# Patient Record
Sex: Female | Born: 1935 | Race: White | Hispanic: No | Smoking: Former smoker
Health system: Southern US, Community
[De-identification: ages and names within clinical notes are randomized; demographics above are authoritative.]

## PROBLEM LIST (undated history)

## (undated) DIAGNOSIS — R634 Abnormal weight loss: Secondary | ICD-10-CM

## (undated) DIAGNOSIS — F0280 Dementia in other diseases classified elsewhere without behavioral disturbance: Secondary | ICD-10-CM

## (undated) DIAGNOSIS — I10 Essential (primary) hypertension: Secondary | ICD-10-CM

## (undated) DIAGNOSIS — G4700 Insomnia, unspecified: Secondary | ICD-10-CM

## (undated) DIAGNOSIS — R1311 Dysphagia, oral phase: Secondary | ICD-10-CM

## (undated) DIAGNOSIS — M179 Osteoarthritis of knee, unspecified: Secondary | ICD-10-CM

## (undated) DIAGNOSIS — R569 Unspecified convulsions: Secondary | ICD-10-CM

## (undated) DIAGNOSIS — E063 Autoimmune thyroiditis: Secondary | ICD-10-CM

## (undated) DIAGNOSIS — E785 Hyperlipidemia, unspecified: Secondary | ICD-10-CM

## (undated) DIAGNOSIS — E559 Vitamin D deficiency, unspecified: Secondary | ICD-10-CM

## (undated) DIAGNOSIS — R112 Nausea with vomiting, unspecified: Secondary | ICD-10-CM

## (undated) DIAGNOSIS — F329 Major depressive disorder, single episode, unspecified: Secondary | ICD-10-CM

## (undated) DIAGNOSIS — C50919 Malignant neoplasm of unspecified site of unspecified female breast: Secondary | ICD-10-CM

## (undated) DIAGNOSIS — R42 Dizziness and giddiness: Secondary | ICD-10-CM

## (undated) DIAGNOSIS — M1710 Unilateral primary osteoarthritis, unspecified knee: Secondary | ICD-10-CM

## (undated) DIAGNOSIS — E46 Unspecified protein-calorie malnutrition: Secondary | ICD-10-CM

## (undated) DIAGNOSIS — F419 Anxiety disorder, unspecified: Secondary | ICD-10-CM

## (undated) DIAGNOSIS — I498 Other specified cardiac arrhythmias: Secondary | ICD-10-CM

## (undated) DIAGNOSIS — G309 Alzheimer's disease, unspecified: Secondary | ICD-10-CM

## (undated) DIAGNOSIS — E039 Hypothyroidism, unspecified: Secondary | ICD-10-CM

## (undated) DIAGNOSIS — E875 Hyperkalemia: Secondary | ICD-10-CM

## (undated) DIAGNOSIS — F32A Depression, unspecified: Secondary | ICD-10-CM

## (undated) DIAGNOSIS — M810 Age-related osteoporosis without current pathological fracture: Secondary | ICD-10-CM

## (undated) HISTORY — DX: Age-related osteoporosis without current pathological fracture: M81.0

## (undated) HISTORY — DX: Abnormal weight loss: R63.4

## (undated) HISTORY — PX: EYE SURGERY: SHX253

## (undated) HISTORY — DX: Dizziness and giddiness: R42

## (undated) HISTORY — DX: Insomnia, unspecified: G47.00

## (undated) HISTORY — DX: Hyperkalemia: E87.5

## (undated) HISTORY — DX: Other specified cardiac arrhythmias: I49.8

## (undated) HISTORY — DX: Autoimmune thyroiditis: E06.3

## (undated) HISTORY — DX: Hypothyroidism, unspecified: E03.9

## (undated) HISTORY — DX: Unilateral primary osteoarthritis, unspecified knee: M17.10

## (undated) HISTORY — DX: Nausea with vomiting, unspecified: R11.2

## (undated) HISTORY — DX: Alzheimer's disease, unspecified: G30.9

## (undated) HISTORY — DX: Depression, unspecified: F32.A

## (undated) HISTORY — DX: Dementia in other diseases classified elsewhere, unspecified severity, without behavioral disturbance, psychotic disturbance, mood disturbance, and anxiety: F02.80

## (undated) HISTORY — DX: Dysphagia, oral phase: R13.11

## (undated) HISTORY — DX: Vitamin D deficiency, unspecified: E55.9

## (undated) HISTORY — DX: Osteoarthritis of knee, unspecified: M17.9

## (undated) HISTORY — DX: Anxiety disorder, unspecified: F41.9

## (undated) HISTORY — DX: Essential (primary) hypertension: I10

## (undated) HISTORY — DX: Hyperlipidemia, unspecified: E78.5

## (undated) HISTORY — DX: Major depressive disorder, single episode, unspecified: F32.9

## (undated) HISTORY — DX: Unspecified protein-calorie malnutrition: E46

---
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
## 1993-10-16 HISTORY — PX: BREAST LUMPECTOMY: SHX2

## 2009-08-26 ENCOUNTER — Encounter: Admission: RE | Admit: 2009-08-26 | Discharge: 2009-08-26 | Payer: Self-pay | Admitting: Internal Medicine

## 2009-11-18 ENCOUNTER — Encounter: Admission: RE | Admit: 2009-11-18 | Discharge: 2009-11-18 | Payer: Self-pay | Admitting: Internal Medicine

## 2010-03-18 ENCOUNTER — Ambulatory Visit (HOSPITAL_COMMUNITY)
Admission: RE | Admit: 2010-03-18 | Discharge: 2010-03-18 | Payer: Self-pay | Source: Home / Self Care | Admitting: Internal Medicine

## 2013-02-06 ENCOUNTER — Ambulatory Visit: Payer: Self-pay | Admitting: Internal Medicine

## 2013-02-27 ENCOUNTER — Encounter: Payer: Self-pay | Admitting: *Deleted

## 2013-02-27 ENCOUNTER — Ambulatory Visit (INDEPENDENT_AMBULATORY_CARE_PROVIDER_SITE_OTHER): Payer: Medicare Other | Admitting: Internal Medicine

## 2013-02-27 ENCOUNTER — Encounter: Payer: Self-pay | Admitting: Internal Medicine

## 2013-02-27 VITALS — BP 136/80 | HR 64 | Temp 97.6°F | Resp 14 | Ht 61.0 in | Wt 94.0 lb

## 2013-02-27 DIAGNOSIS — F419 Anxiety disorder, unspecified: Secondary | ICD-10-CM

## 2013-02-27 DIAGNOSIS — F0280 Dementia in other diseases classified elsewhere without behavioral disturbance: Secondary | ICD-10-CM

## 2013-02-27 DIAGNOSIS — E875 Hyperkalemia: Secondary | ICD-10-CM

## 2013-02-27 DIAGNOSIS — F32A Depression, unspecified: Secondary | ICD-10-CM | POA: Insufficient documentation

## 2013-02-27 DIAGNOSIS — G309 Alzheimer's disease, unspecified: Secondary | ICD-10-CM

## 2013-02-27 DIAGNOSIS — F329 Major depressive disorder, single episode, unspecified: Secondary | ICD-10-CM | POA: Insufficient documentation

## 2013-02-27 DIAGNOSIS — E039 Hypothyroidism, unspecified: Secondary | ICD-10-CM

## 2013-02-27 DIAGNOSIS — M810 Age-related osteoporosis without current pathological fracture: Secondary | ICD-10-CM

## 2013-02-27 DIAGNOSIS — F341 Dysthymic disorder: Secondary | ICD-10-CM

## 2013-02-27 DIAGNOSIS — E559 Vitamin D deficiency, unspecified: Secondary | ICD-10-CM

## 2013-02-27 MED ORDER — SERTRALINE HCL 50 MG PO TABS: 25.0000 mg | ORAL_TABLET | Freq: Every day | ORAL | Status: DC

## 2013-02-27 NOTE — Progress Notes (Signed)
Patient ID: Theresa Rodriguez, female   DOB: Dec 24, 1921, 79 y.o.   MRN: [id number] Code Status: DNR, was completed in Jan 2014, in [facility]  No Known Allergies  Chief Complaint  Patient presents with  . Medical Managment of Chronic Issues    HPI: Patient is a 79 y.o. white female seen in the office today for medical mgt of chronic diseases.   Tessalon perles made her hallucinate.  She is very anxious and repetitive questioning.  "Am I going to be alone?" "Who am I going to stay with?"  When one daughter was away Theresa Rodriguez), her other daughter stayed with her instead of Theresa Rodriguez.  Wakes up at night either dreaming or seeing people in her room in the middle of the night.  She is still eating pretty well.  Eats more than once sometimes b/c forgets to eat.  Anxiety is getting much worse per daughter--happens daily.  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    Review of Systems:  Review of Systems  Constitutional: Positive for weight loss. Negative for fever, chills and malaise/fatigue.  HENT: Negative for congestion.   Eyes: Negative for blurred vision.  Respiratory: Negative for cough and shortness of breath.   Cardiovascular: Negative for chest pain.  Gastrointestinal: Negative for heartburn, abdominal pain and constipation.  Genitourinary: Negative for dysuria, urgency, frequency and flank pain.  Musculoskeletal: Positive for joint pain.       Knees, not so bad recently  Skin: Negative for rash.  Neurological: Negative for dizziness, focal weakness, loss of consciousness, weakness and headaches.  Endo/Heme/Allergies: Does not bruise/bleed easily.  Psychiatric/Behavioral: Positive for depression, hallucinations and memory loss. The patient is nervous/anxious.      Past Medical History  Diagnosis Date  . Unspecified vitamin D deficiency   . Insomnia, unspecified   . Other specified cardiac dysrhythmias   . Nausea with  vomiting   . Dysphagia, oral phase   . Loss of weight   . Hyperpotassemia   . Dizziness and giddiness   . Chronic lymphocytic thyroiditis   . Other and unspecified hyperlipidemia   . Unspecified hypothyroidism   . Unspecified protein-calorie malnutrition   . Alzheimer's disease   . Malignant neoplasm of breast (female), unspecified site   . Osteoarthrosis, unspecified whether generalized or localized, unspecified site   . Osteoporosis, unspecified   . Unspecified essential hypertension    Past Surgical History  Procedure Laterality Date  . Breast lumpectomy  1995  . Eye surgery     Social History:   reports that she has quit smoking. She does not have any smokeless tobacco history on file. She reports that she does not drink alcohol or use illicit drugs.  Family History  Problem Relation Age of Onset  . Pneumonia Mother   . Heart attack Father   . Pneumonia Brother     Medications: Patient's Medications  New Prescriptions   No medications on file  Previous Medications   ACETAMINOPHEN (TYLENOL) 500 MG TABLET    Take one to two tablets by mouth up to three times daily for pain   CHOLECALCIFEROL (VITAMIN D) 2000 UNITS TABLET    Take one tablet by mouth once daily for vitamin d   DICLOFENAC SODIUM (VOLTAREN) 1 % GEL    Apply 4 grams four times daily to knees as needed for pain   DIETARY MANAGEMENT PRODUCT (AXONA PO)    Take one packet once daily  to help preserve memory   DONEPEZIL (ARICEPT) 5 MG TABLET    Take one tablet once daily for memory   HYDROCHLOROTHIAZIDE (HYDRODIURIL) 25 MG TABLET    Take one tablet by mouth once daily to control blood pressure and edema  Modified Medications   No medications on file  Discontinued Medications   No medications on file     Physical Exam:  Filed Vitals:   02/27/13 1107  BP: 136/80  Pulse: 64  Temp: 97.6 F (36.4 C)  TempSrc: Oral  Resp: 14  Height: 5\' 1"  (1.549 m)  Weight: 94 lb (42.638 kg)  Physical Exam   Constitutional:  Thin white female, nad  HENT:  Head: Normocephalic and atraumatic.  Cardiovascular: Normal rate, regular rhythm, normal heart sounds and intact distal pulses.   Pulmonary/Chest: Breath sounds normal. No respiratory distress.  Abdominal: Soft. Bowel sounds are normal. She exhibits no distension. There is no tenderness.  Genitourinary:  No suprapubic tenderness or distention  Musculoskeletal: Normal range of motion. She exhibits no edema and no tenderness.  Mild crepitus of knees bilaterally  Neurological: She is alert. She displays normal reflexes. No cranial nerve deficit. She exhibits normal muscle tone. Coordination normal.  Oriented to person and place, not time  Skin: Skin is warm and dry.   Labs reviewed: Last labs 12/13:  tsh nl, cr 1.15, vit D 31  Past Procedures: Bone density 11/10--osteoporosis Refuses further mammograms--last was before 11/09 mmse 07/08/12:  13/30, failed clock--was unchanged from 8/12 Cscope no longer indicated due to age  Assessment/Plan 1. Alzheimer's disease - DNR (Do Not Resuscitate) was reviewed with both daughters 1/14--is unchanged today -cont current dose of aricept, cannot tolerate higher due to bradycardia, syncope and namenda caused vivid dreams -cont axona per family request -MMSE was stable over 1 year but has increased anxiety depression and hallucinations vs. dreams 2. Anxiety and depression Will begin low dose zoloft to see if it helps with compulsive concerns about being alone as simple reassurance has not been helping and her family is going crazy trying to keep her calm Hallucinations/vivid dreams have not become a big enough concern to warrant therapy at this point - sertraline (ZOLOFT) 50 MG tablet; Take 0.5 tablets (25 mg total) by mouth daily. For two weeks, then daily thereafter  Dispense: 45 tablet; Refill: 0 3. Senile osteoporosis --on vitamin D supplement, last level was still below 40 --increase to 2000 iu  daily --previously on bisphosphonates in 2012, but affected her appetite --will discuss prolia or reclast next time 4. Hypothyroidism - follow up TSH 5. Hyperpotassemia - CMP - Magnesium -avoid too many bananas, sweet potatoes, fruits with high potassium content--reviewed with patient and daughter 6. Vitamin D deficiency -increase to 2000 IU daily, discuss alternative osteoporosis treatments next visit  Labs/tests ordered:  Tsh, cmp, mg

## 2013-02-28 LAB — COMPREHENSIVE METABOLIC PANEL
ALT: 19 IU/L (ref 0–32)
AST: 29 IU/L (ref 0–40)
Albumin/Globulin Ratio: 1.5 (ref 1.1–2.5)
Albumin: 4.6 g/dL (ref 3.5–4.7)
Alkaline Phosphatase: 78 IU/L (ref 39–117)
BUN/Creatinine Ratio: 32 — ABNORMAL HIGH (ref 11–26)
BUN: 36 mg/dL — ABNORMAL HIGH (ref 8–27)
CO2: 27 mmol/L (ref 19–28)
Calcium: 10.1 mg/dL (ref 8.6–10.2)
Chloride: 93 mmol/L — ABNORMAL LOW (ref 97–108)
Creatinine, Ser: 1.13 mg/dL — ABNORMAL HIGH (ref 0.57–1.00)
GFR calc Af Amer: 50 mL/min/{1.73_m2} — ABNORMAL LOW (ref >59)
GFR calc non Af Amer: 43 mL/min/{1.73_m2} — ABNORMAL LOW (ref >59)
Globulin, Total: 3 g/dL (ref 1.5–4.5)
Glucose: 85 mg/dL (ref 65–99)
Potassium: 4.1 mmol/L (ref 3.5–5.2)
Sodium: 138 mmol/L (ref 134–144)
Total Bilirubin: 0.4 mg/dL (ref 0.0–1.2)
Total Protein: 7.6 g/dL (ref 6.0–8.5)

## 2013-02-28 LAB — TSH: TSH: 3.65 u[IU]/mL (ref 0.450–4.500)

## 2013-02-28 LAB — MAGNESIUM: Magnesium: 2 mg/dL (ref 1.6–2.6)

## 2013-03-28 ENCOUNTER — Other Ambulatory Visit: Payer: Self-pay | Admitting: *Deleted

## 2013-03-28 MED ORDER — DONEPEZIL HCL 5 MG PO TABS: ORAL_TABLET | ORAL | Status: DC

## 2013-04-07 ENCOUNTER — Ambulatory Visit: Payer: Medicare Other | Admitting: Internal Medicine

## 2013-05-06 ENCOUNTER — Other Ambulatory Visit: Payer: Self-pay | Admitting: Geriatric Medicine

## 2013-05-06 MED ORDER — DONEPEZIL HCL 5 MG PO TABS: ORAL_TABLET | ORAL | Status: DC

## 2013-06-19 ENCOUNTER — Other Ambulatory Visit: Payer: Self-pay | Admitting: *Deleted

## 2013-06-19 DIAGNOSIS — E039 Hypothyroidism, unspecified: Secondary | ICD-10-CM

## 2013-06-19 DIAGNOSIS — E875 Hyperkalemia: Secondary | ICD-10-CM

## 2013-06-25 ENCOUNTER — Other Ambulatory Visit: Payer: Medicare Other

## 2013-06-25 DIAGNOSIS — E039 Hypothyroidism, unspecified: Secondary | ICD-10-CM

## 2013-06-25 DIAGNOSIS — E875 Hyperkalemia: Secondary | ICD-10-CM

## 2013-06-26 LAB — COMPREHENSIVE METABOLIC PANEL
ALT: 15 IU/L (ref 0–32)
AST: 26 IU/L (ref 0–40)
Albumin/Globulin Ratio: 1.6 (ref 1.1–2.5)
Albumin: 4.4 g/dL (ref 3.5–4.7)
Alkaline Phosphatase: 70 IU/L (ref 39–117)
BUN/Creatinine Ratio: 31 — ABNORMAL HIGH (ref 11–26)
BUN: 40 mg/dL — ABNORMAL HIGH (ref 8–27)
CO2: 30 mmol/L — ABNORMAL HIGH (ref 18–29)
Calcium: 9.7 mg/dL (ref 8.6–10.2)
Chloride: 95 mmol/L — ABNORMAL LOW (ref 97–108)
Creatinine, Ser: 1.3 mg/dL — ABNORMAL HIGH (ref 0.57–1.00)
GFR calc Af Amer: 42 mL/min/{1.73_m2} — ABNORMAL LOW (ref >59)
GFR calc non Af Amer: 37 mL/min/{1.73_m2} — ABNORMAL LOW (ref >59)
Globulin, Total: 2.7 g/dL (ref 1.5–4.5)
Glucose: 92 mg/dL (ref 65–99)
Potassium: 3.7 mmol/L (ref 3.5–5.2)
Sodium: 142 mmol/L (ref 134–144)
Total Bilirubin: 0.4 mg/dL (ref 0.0–1.2)
Total Protein: 7.1 g/dL (ref 6.0–8.5)

## 2013-06-26 LAB — TSH: TSH: 4.21 u[IU]/mL (ref 0.450–4.500)

## 2013-06-27 ENCOUNTER — Other Ambulatory Visit: Payer: Medicare Other

## 2013-06-30 ENCOUNTER — Encounter: Payer: Self-pay | Admitting: Internal Medicine

## 2013-06-30 ENCOUNTER — Ambulatory Visit (INDEPENDENT_AMBULATORY_CARE_PROVIDER_SITE_OTHER): Payer: Medicare Other | Admitting: Internal Medicine

## 2013-06-30 VITALS — BP 114/70 | HR 64 | Temp 97.7°F | Wt 92.8 lb

## 2013-06-30 DIAGNOSIS — F0280 Dementia in other diseases classified elsewhere without behavioral disturbance: Secondary | ICD-10-CM

## 2013-06-30 DIAGNOSIS — F32A Depression, unspecified: Secondary | ICD-10-CM

## 2013-06-30 DIAGNOSIS — F341 Dysthymic disorder: Secondary | ICD-10-CM

## 2013-06-30 DIAGNOSIS — F329 Major depressive disorder, single episode, unspecified: Secondary | ICD-10-CM

## 2013-06-30 DIAGNOSIS — I10 Essential (primary) hypertension: Secondary | ICD-10-CM

## 2013-06-30 MED ORDER — HYDROCHLOROTHIAZIDE 25 MG PO TABS: 12.5000 mg | ORAL_TABLET | Freq: Every day | ORAL | Status: DC

## 2013-06-30 MED ORDER — SERTRALINE HCL 25 MG PO TABS: 25.0000 mg | ORAL_TABLET | Freq: Every day | ORAL | Status: DC

## 2013-06-30 NOTE — Progress Notes (Signed)
Patient ID: Theresa Rodriguez, female   DOB: 1921/11/14, 79 y.o.   MRN: [id number] Location:  [facility] [place] [facility]  Code Status: DNR   No Known Allergies  Chief Complaint  Patient presents with  . Medical Managment of Chronic Issues    follow-up, discuss labs (copy printed)   . Medication Management    discuss Zoloft     HPI: Patient is a 79 y.o. white female seen in the office today for management of chronic illness.  Daughter refuses to give patient the zoloft because it is an everyday medication and pt. will not take it everyday. Requests that she just have something PRN for anxiety. Pt. states that her mood is fine. Pt. states that she doesn't like coming to the doctors office but understands why she has to have routine checkups. Pt is sleeping well, does not eat very well, has lost weight again.    Review of Systems:  Review of Systems  Constitutional: Positive for weight loss.       Daughter states that she has to be reminded to eat and drink  Respiratory: Negative for shortness of breath.   Cardiovascular: Negative for chest pain.  Gastrointestinal: Negative for diarrhea and constipation.  Genitourinary: Negative for dysuria and urgency.  Psychiatric/Behavioral: Positive for memory loss. Negative for depression. The patient is nervous/anxious.        Memory about the same. Anxiety- afraid of being alone.      Past Medical History  Diagnosis Date  . Vitamin D deficiency   . Insomnia, unspecified   . Other specified cardiac dysrhythmias(427.89)   . Nausea with vomiting   . Dysphagia, oral phase   . Loss of weight   . Hyperpotassemia   . Dizziness and giddiness   . Chronic lymphocytic thyroiditis   . Other and unspecified hyperlipidemia   . Hypothyroidism   . Unspecified protein-calorie malnutrition   . Alzheimer's disease   . Malignant neoplasm of breast (female), unspecified site   . Osteoarthritis, knee   . Senile osteoporosis    . Benign hypertension   . Anxiety and depression     Past Surgical History  Procedure Laterality Date  . Breast lumpectomy  1995  . Eye surgery      Social History:   reports that she has quit smoking. She does not have any smokeless tobacco history on file. She reports that she does not drink alcohol or use illicit drugs.  Family History  Problem Relation Age of Onset  . Pneumonia Mother   . Heart attack Father   . Pneumonia Brother     Medications: Patient's Medications  New Prescriptions   No medications on file  Previous Medications   ACETAMINOPHEN (TYLENOL) 500 MG TABLET    Take one to two tablets by mouth up to three times daily for pain   CHOLECALCIFEROL (VITAMIN D3 ADULT GUMMIES PO)    Take by mouth. 2 by mouth daily   DICLOFENAC SODIUM (VOLTAREN) 1 % GEL    Apply 4 grams four times daily to knees as needed for pain   DIETARY MANAGEMENT PRODUCT (AXONA PO)    3 tablespoons once daily to help preserve memory   DONEPEZIL (ARICEPT) 5 MG TABLET    Take one tablet in the morning and one tablet in the evening.   HYDROCHLOROTHIAZIDE (HYDRODIURIL) 25 MG TABLET    Take one tablet by mouth once daily to control blood pressure and edema  Modified Medications   No  medications on file  Discontinued Medications   CHOLECALCIFEROL (VITAMIN D) 2000 UNITS TABLET    Take one tablet by mouth once daily for vitamin d   SERTRALINE (ZOLOFT) 50 MG TABLET    Take 0.5 tablets (25 mg total) by mouth daily. For two weeks, then daily thereafter     Physical Exam: Filed Vitals:   06/30/13 1038  BP: 114/70  Pulse: 64  Temp: 97.7 F (36.5 C)  TempSrc: Oral  Weight: 92 lb 12.8 oz (42.094 kg)   Physical Exam  Constitutional: She appears well-developed and well-nourished.  Cardiovascular: Normal rate, regular rhythm and intact distal pulses.   Pulmonary/Chest: Effort normal and breath sounds normal.  Abdominal: Soft. Bowel sounds are normal.  Neurological: She is alert.  Oriented to  person and place but not time  Skin: Skin is warm and dry.  Psychiatric: She has a normal mood and affect.     Labs reviewed: Basic Metabolic Panel:  Recent Labs  16/10/96 1214 06/25/13 0924  NA 138 142  K 4.1 3.7  CL 93* 95*  CO2 27 30*  GLUCOSE 85 92  BUN 36* 40*  CREATININE 1.13* 1.30*  CALCIUM 10.1 9.7  MG 2.0  --   TSH 3.650 4.210   Liver Function Tests:  Recent Labs  02/27/13 1214 06/25/13 0924  AST 29 26  ALT 19 15  ALKPHOS 78 70  BILITOT 0.4 0.4  PROT 7.6 7.1   Assessment/Plan 1. Essential hypertension, benign - has been well controlled--suspect she does not require a full 25mg  tablet anymore--cut back to 12.5mg  daily--faxed to pharmacy - Basic metabolic panel; Future to reassess renal function  2. Anxiety and depression -pt has appeared depressed to me for the past several visits -her other daughter thought the zoloft was a good idea -would like to avoid benzos due to side effects--she is not falling now, but likely will begin falling as part of her dementia progression -explained in detail how benzos are bad for the patient -again encouraged use of zoloft--may help pt's appetite and weight, as well and should prevent anxious episodes that happen when she is at home too long  3. Alzheimer's disease -has been stable, poor short term memory--remains on axona per family request, low dose aricept due to bradycardia with high dose and side effects of namenda  Labs/tests ordered:  Bmp before visit in december Next appt: 3 mos

## 2013-07-31 ENCOUNTER — Ambulatory Visit (INDEPENDENT_AMBULATORY_CARE_PROVIDER_SITE_OTHER): Payer: Medicare Other | Admitting: Nurse Practitioner

## 2013-07-31 ENCOUNTER — Encounter: Payer: Self-pay | Admitting: Nurse Practitioner

## 2013-07-31 VITALS — BP 100/62 | HR 84 | Temp 97.8°F | Wt 90.0 lb

## 2013-07-31 DIAGNOSIS — F341 Dysthymic disorder: Secondary | ICD-10-CM

## 2013-07-31 DIAGNOSIS — F0280 Dementia in other diseases classified elsewhere without behavioral disturbance: Secondary | ICD-10-CM

## 2013-07-31 DIAGNOSIS — I10 Essential (primary) hypertension: Secondary | ICD-10-CM

## 2013-07-31 DIAGNOSIS — F329 Major depressive disorder, single episode, unspecified: Secondary | ICD-10-CM

## 2013-07-31 DIAGNOSIS — R05 Cough: Secondary | ICD-10-CM

## 2013-07-31 DIAGNOSIS — R197 Diarrhea, unspecified: Secondary | ICD-10-CM

## 2013-07-31 NOTE — Progress Notes (Signed)
Patient ID: Theresa Rodriguez, female   DOB: 16-Oct-1922, 79 y.o.   MRN: [id number]   No Known Allergies  Chief Complaint  Patient presents with  . URI    cough- seen at urgent care, completed zpak  . Fatigue    ongoing concern     HPI: Patient is a 79 y.o. female seen in the office today for follow up URI; here with daughters at visit who provide most of the history Went to urgent care oct 5th and then again oct 8th; she was started on a z pack; finished this 1 week ago today; has had several episode of incontience since on antibiotics with diarrhea (daughter also giving her a supplement that can have GI side effects this has since been stopped); still has cough - worse at night; Taking Cheratussin which has helped;  Episodes of having trouble swallowing and short breathing episodes-- may be related to anxiety--has not started zoloft worried about side effects (daughter went out of town and did not want to start new medication while she was gone) - has not taken HCTZ - blood pressure has been low Review of Systems:  Review of Systems  Constitutional: Negative for fever and chills.       Daughter states that she has to be reminded to eat and drink-- worse since she has had URI  Respiratory: Positive for cough. Negative for sputum production, shortness of breath and wheezing.   Cardiovascular: Negative for chest pain.  Gastrointestinal: Positive for diarrhea. Negative for abdominal pain and constipation.  Genitourinary: Negative for dysuria and urgency.  Psychiatric/Behavioral: Positive for memory loss. Negative for depression. The patient is nervous/anxious.        Anxiety- afraid of being alone.      Past Medical History  Diagnosis Date  . Vitamin D deficiency   . Insomnia, unspecified   . Other specified cardiac dysrhythmias(427.89)   . Nausea with vomiting   . Dysphagia, oral phase   . Loss of weight   . Hyperpotassemia   . Dizziness and giddiness   . Chronic lymphocytic  thyroiditis   . Other and unspecified hyperlipidemia   . Hypothyroidism   . Unspecified protein-calorie malnutrition   . Alzheimer's disease   . Malignant neoplasm of breast (female), unspecified site   . Osteoarthritis, knee   . Senile osteoporosis   . Benign hypertension   . Anxiety and depression    Past Surgical History  Procedure Laterality Date  . Breast lumpectomy  1995  . Eye surgery     Social History:   reports that she has quit smoking. She does not have any smokeless tobacco history on file. She reports that she does not drink alcohol or use illicit drugs.  Family History  Problem Relation Age of Onset  . Pneumonia Mother   . Heart attack Father   . Pneumonia Brother     Medications: Patient's Medications  New Prescriptions   No medications on file  Previous Medications   ACETAMINOPHEN (TYLENOL) 500 MG TABLET    Take one to two tablets by mouth up to three times daily for pain   CHOLECALCIFEROL (VITAMIN D3 ADULT GUMMIES PO)    Take by mouth. 2 by mouth daily   DICLOFENAC SODIUM (VOLTAREN) 1 % GEL    Apply 4 grams four times daily to knees as needed for pain   DIETARY MANAGEMENT PRODUCT (AXONA PO)    3 tablespoons once daily to help preserve memory   DONEPEZIL (ARICEPT) 5 MG TABLET  Take one tablet in the morning and one tablet in the evening.   HYDROCHLOROTHIAZIDE (HYDRODIURIL) 25 MG TABLET    Take 0.5 tablets (12.5 mg total) by mouth daily. Take one tablet by mouth once daily to control blood pressure and edema   SERTRALINE (ZOLOFT) 25 MG TABLET    Take 1 tablet (25 mg total) by mouth daily.  Modified Medications   No medications on file  Discontinued Medications   No medications on file     Physical Exam:  Filed Vitals:   07/31/13 0943  BP: 100/62  Pulse: 84  Temp: 97.8 F (36.6 C)  TempSrc: Oral  Weight: 90 lb (40.824 kg)  SpO2: 90%    Physical Exam  Constitutional: No distress.  Thin female  HENT:  Head: Normocephalic and atraumatic.   Mouth/Throat: Oropharynx is clear and moist. No oropharyngeal exudate.  Eyes: Conjunctivae and EOM are normal. Pupils are equal, round, and reactive to light.  Neck: Normal range of motion. Neck supple.  Cardiovascular: Normal rate, regular rhythm and normal heart sounds.   Pulmonary/Chest: Effort normal and breath sounds normal.  o2 sats 95% on recheck  Abdominal: Soft. Bowel sounds are normal. She exhibits no distension. There is no tenderness.  Neurological: She is alert.  Skin: Skin is warm and dry. She is not diaphoretic. No erythema.     Labs reviewed: Basic Metabolic Panel:  Recent Labs  46/96/29 1214 06/25/13 0924  NA 138 142  K 4.1 3.7  CL 93* 95*  CO2 27 30*  GLUCOSE 85 92  BUN 36* 40*  CREATININE 1.13* 1.30*  CALCIUM 10.1 9.7  MG 2.0  --   TSH 3.650 4.210   Liver Function Tests:  Recent Labs  02/27/13 1214 06/25/13 0924  AST 29 26  ALT 19 15  ALKPHOS 78 70  BILITOT 0.4 0.4  PROT 7.6 7.1    Assessment/Plan 1. Cough Ongoing;  was treated with z-pack last week-- still left with cough however has improved still needing cough medication at night however she is drowsy during the day  - to decrease Cheratussin to half the dose and may try delsym for cough since it does not have codeine which can add to fatigue   2. Alzheimer's disease progressively worse now pt with more episodes of incontinence; will stop HCTZ and encouraged probiotic and yogurt for bowel health (also to stop supplement); information provided for resources and support  -encouraged daughter to read 36 hour day and to educate herself on disease progression she admits she had not done any research and not aware of progression because she "does not want to know" but is willing to do some research at this point. Education provided regarding disease progression   3. Anxiety and depression - zoloft ordered but has not started yet -fear of pt having hallucinations ; may start at 1/2 tablet for a  week then increase to 1 tablet   4. Diarrhea Worse while on antibiotic however was having before at times; daughter reports she was giving her mother a supplement that had GI symptoms as side effect so she has stopped this.  - to take probiotic daily or yogurt to help with GI health  5. Hypertension To stop HCTZ will follow up with nurse visit for blood pressure check in 1-2 weeks  45 mins Time TOTAL:  time greater than 50% of total time spent doing pt counseled and coordination of care regarding disease process and progression and ongoing education regarding chronic illnesses.

## 2013-07-31 NOTE — Patient Instructions (Signed)
May quit taking HCTZ- Come back in 1-2 weeks for blood pressure check  May use delsym cough syrup if codeine making her too sleepy  36 hour day for education on dementia   Start 1/2 tablet of zoloft then after 1-2 weeks increase to 1 tablet

## 2013-09-22 ENCOUNTER — Emergency Department (HOSPITAL_COMMUNITY): Payer: Medicare Other

## 2013-09-22 ENCOUNTER — Observation Stay (HOSPITAL_COMMUNITY)
Admission: EM | Admit: 2013-09-22 | Discharge: 2013-09-23 | Disposition: A | Discharge status: Home / Self Care | Payer: Medicare Other | Attending: Internal Medicine | Admitting: Internal Medicine

## 2013-09-22 ENCOUNTER — Encounter (HOSPITAL_COMMUNITY): Payer: Self-pay | Admitting: Emergency Medicine

## 2013-09-22 DIAGNOSIS — F0280 Dementia in other diseases classified elsewhere without behavioral disturbance: Secondary | ICD-10-CM | POA: Insufficient documentation

## 2013-09-22 DIAGNOSIS — R55 Syncope and collapse: Principal | ICD-10-CM | POA: Insufficient documentation

## 2013-09-22 DIAGNOSIS — Z87891 Personal history of nicotine dependence: Secondary | ICD-10-CM | POA: Insufficient documentation

## 2013-09-22 DIAGNOSIS — E559 Vitamin D deficiency, unspecified: Secondary | ICD-10-CM | POA: Insufficient documentation

## 2013-09-22 DIAGNOSIS — M1710 Unilateral primary osteoarthritis, unspecified knee: Secondary | ICD-10-CM | POA: Insufficient documentation

## 2013-09-22 DIAGNOSIS — M25551 Pain in right hip: Secondary | ICD-10-CM

## 2013-09-22 DIAGNOSIS — E039 Hypothyroidism, unspecified: Secondary | ICD-10-CM | POA: Insufficient documentation

## 2013-09-22 DIAGNOSIS — E46 Unspecified protein-calorie malnutrition: Secondary | ICD-10-CM | POA: Insufficient documentation

## 2013-09-22 DIAGNOSIS — F3289 Other specified depressive episodes: Secondary | ICD-10-CM | POA: Insufficient documentation

## 2013-09-22 DIAGNOSIS — F411 Generalized anxiety disorder: Secondary | ICD-10-CM | POA: Insufficient documentation

## 2013-09-22 DIAGNOSIS — R1310 Dysphagia, unspecified: Secondary | ICD-10-CM | POA: Diagnosis present

## 2013-09-22 DIAGNOSIS — E785 Hyperlipidemia, unspecified: Secondary | ICD-10-CM | POA: Insufficient documentation

## 2013-09-22 DIAGNOSIS — G309 Alzheimer's disease, unspecified: Secondary | ICD-10-CM | POA: Insufficient documentation

## 2013-09-22 DIAGNOSIS — R569 Unspecified convulsions: Secondary | ICD-10-CM | POA: Insufficient documentation

## 2013-09-22 DIAGNOSIS — E875 Hyperkalemia: Secondary | ICD-10-CM

## 2013-09-22 DIAGNOSIS — F341 Dysthymic disorder: Secondary | ICD-10-CM

## 2013-09-22 DIAGNOSIS — M25559 Pain in unspecified hip: Secondary | ICD-10-CM | POA: Insufficient documentation

## 2013-09-22 DIAGNOSIS — M169 Osteoarthritis of hip, unspecified: Secondary | ICD-10-CM | POA: Diagnosis present

## 2013-09-22 DIAGNOSIS — F329 Major depressive disorder, single episode, unspecified: Secondary | ICD-10-CM

## 2013-09-22 DIAGNOSIS — M810 Age-related osteoporosis without current pathological fracture: Secondary | ICD-10-CM | POA: Insufficient documentation

## 2013-09-22 DIAGNOSIS — IMO0002 Reserved for concepts with insufficient information to code with codable children: Secondary | ICD-10-CM | POA: Insufficient documentation

## 2013-09-22 DIAGNOSIS — I10 Essential (primary) hypertension: Secondary | ICD-10-CM | POA: Insufficient documentation

## 2013-09-22 DIAGNOSIS — I498 Other specified cardiac arrhythmias: Secondary | ICD-10-CM | POA: Insufficient documentation

## 2013-09-22 DIAGNOSIS — Z853 Personal history of malignant neoplasm of breast: Secondary | ICD-10-CM | POA: Insufficient documentation

## 2013-09-22 DIAGNOSIS — Z79899 Other long term (current) drug therapy: Secondary | ICD-10-CM | POA: Insufficient documentation

## 2013-09-22 HISTORY — DX: Unspecified convulsions: R56.9

## 2013-09-22 HISTORY — DX: Anxiety disorder, unspecified: F41.9

## 2013-09-22 HISTORY — DX: Malignant neoplasm of unspecified site of unspecified female breast: C50.919

## 2013-09-22 LAB — URINALYSIS, ROUTINE W REFLEX MICROSCOPIC
Glucose, UA: NEGATIVE mg/dL
Ketones, ur: NEGATIVE mg/dL
Protein, ur: NEGATIVE mg/dL
Specific Gravity, Urine: 1.017 (ref 1.005–1.030)
Urobilinogen, UA: 0.2 mg/dL (ref 0.0–1.0)

## 2013-09-22 LAB — CBC WITH DIFFERENTIAL/PLATELET
Basophils Absolute: 0 10*3/uL (ref 0.0–0.1)
Eosinophils Relative: 0 % (ref 0–5)
Lymphocytes Relative: 11 % — ABNORMAL LOW (ref 12–46)
MCHC: 32.2 g/dL (ref 30.0–36.0)
Monocytes Absolute: 0.6 10*3/uL (ref 0.1–1.0)
Monocytes Relative: 7 % (ref 3–12)
Platelets: 232 10*3/uL (ref 150–400)
RDW: 15.7 % — ABNORMAL HIGH (ref 11.5–15.5)

## 2013-09-22 LAB — BASIC METABOLIC PANEL
CO2: 28 mEq/L (ref 19–32)
Calcium: 9.3 mg/dL (ref 8.4–10.5)
Chloride: 100 mEq/L (ref 96–112)
Creatinine, Ser: 0.86 mg/dL (ref 0.50–1.10)
GFR calc Af Amer: 67 mL/min — ABNORMAL LOW (ref >90)
GFR calc non Af Amer: 58 mL/min — ABNORMAL LOW (ref >90)
Glucose, Bld: 104 mg/dL — ABNORMAL HIGH (ref 70–99)

## 2013-09-22 LAB — URINE MICROSCOPIC-ADD ON

## 2013-09-22 LAB — TROPONIN I: Troponin I: <0.3 ng/mL (ref <0.30)

## 2013-09-22 LAB — POCT I-STAT TROPONIN I: Troponin i, poc: 0.01 ng/mL (ref 0.00–0.08)

## 2013-09-22 MED ORDER — LORAZEPAM 2 MG/ML IJ SOLN: 0.5000 mg | Freq: Four times a day (QID) | INTRAMUSCULAR | Status: DC | PRN

## 2013-09-22 MED ORDER — SODIUM CHLORIDE 0.9 % IJ SOLN: 3.0000 mL | Freq: Two times a day (BID) | INTRAMUSCULAR | Status: DC

## 2013-09-22 MED ORDER — ACETAMINOPHEN 325 MG PO TABS
650.0000 mg | ORAL_TABLET | Freq: Four times a day (QID) | ORAL | Status: DC | PRN
  Administered 2013-09-22: 650 mg via ORAL
  Filled 2013-09-22: qty 2

## 2013-09-22 MED ORDER — ONDANSETRON HCL 4 MG/2ML IJ SOLN: 4.0000 mg | Freq: Four times a day (QID) | INTRAMUSCULAR | Status: DC | PRN

## 2013-09-22 MED ORDER — ENOXAPARIN SODIUM 40 MG/0.4ML ~~LOC~~ SOLN
40.0000 mg | SUBCUTANEOUS | Status: DC
  Filled 2013-09-22 (×2): qty 0.4

## 2013-09-22 MED ORDER — SODIUM CHLORIDE 0.9 % IV SOLN
INTRAVENOUS | Status: AC
  Administered 2013-09-22: 18:00:00 via INTRAVENOUS

## 2013-09-22 MED ORDER — POLYETHYLENE GLYCOL 3350 17 G PO PACK
17.0000 g | PACK | Freq: Every day | ORAL | Status: DC | PRN
  Filled 2013-09-22: qty 1

## 2013-09-22 MED ORDER — DICLOFENAC SODIUM 1 % TD GEL
2.0000 g | Freq: Four times a day (QID) | TRANSDERMAL | Status: DC
  Administered 2013-09-23: 2 g via TOPICAL
  Filled 2013-09-22 (×2): qty 100

## 2013-09-22 MED ORDER — KETOROLAC TROMETHAMINE 30 MG/ML IJ SOLN: 15.0000 mg | Freq: Three times a day (TID) | INTRAMUSCULAR | Status: DC | PRN

## 2013-09-22 MED ORDER — DONEPEZIL HCL 5 MG PO TABS
5.0000 mg | ORAL_TABLET | Freq: Every day | ORAL | Status: DC
  Administered 2013-09-22: 5 mg via ORAL
  Filled 2013-09-22 (×2): qty 1

## 2013-09-22 MED ORDER — MORPHINE SULFATE (CONCENTRATE) 10 MG /0.5 ML PO SOLN: 1.0000 mg | Freq: Four times a day (QID) | ORAL | Status: DC | PRN

## 2013-09-22 MED ORDER — KETOROLAC TROMETHAMINE 30 MG/ML IJ SOLN
15.0000 mg | Freq: Once | INTRAMUSCULAR | Status: AC
  Administered 2013-09-22: 15 mg via INTRAVENOUS
  Filled 2013-09-22: qty 1

## 2013-09-22 MED ORDER — ONDANSETRON HCL 4 MG PO TABS: 4.0000 mg | ORAL_TABLET | Freq: Four times a day (QID) | ORAL | Status: DC | PRN

## 2013-09-22 NOTE — ED Notes (Signed)
Family discussing treatment with hospitalist at this time, patient to be admitted to 3[facility], patient in NAD at this time,

## 2013-09-22 NOTE — ED Notes (Signed)
Family states patient this am c/o right leg and hip pain and also had episode where they described her as her eyes rolling in back of head and becoming weak where she could not stand, another episode at approx 0930 after patient had walked to restroom

## 2013-09-22 NOTE — ED Notes (Signed)
Radiology tech reported to RN that pt was unable to tolerate hip xray. PA notified. Will try again after pain medication administration.

## 2013-09-22 NOTE — ED Provider Notes (Signed)
CSN: [id number]     Arrival date & time 09/22/13  1028 History   First MD Initiated Contact with Patient 09/22/13 1058     Chief Complaint  Patient presents with  . Weakness  . Hip Pain   (Consider location/radiation/quality/duration/timing/severity/associated sxs/prior Treatment) HPI Comments: Patient is an 79 year old female with a past medical history of Alzheimer's, hypertension, vitamin D deficiency, remote history of breast cancer who presents with 2 syncopal episodes that occurred this morning and right hip pain. Patient's family is present who provides the history. Patient's daughter reports the patient complaining of right hip pain that started this morning. The pain is aching and severe without radiation and not associated with trauma or fall. Patient reports she is unable to bear weight on the hip due to pain. Patient tried topical diclofenac gel and tylenol for pain which provided some relief.   Patient's family also reports witnessing 2 episodes of syncope which appeared as seizures. Patient was seated and suddenly "her eyes rolled back in her head and she was shaking." The episode lasted about 30 seconds before spontaneously resolving. Patient had a subsequent episode of the same syncope episode with seizure-like activity where she lost control of her bowels and bladder. Patient has never had these symptoms previously. No aggravating/alleviating factors.     Patient is a 79 y.o. female presenting with weakness and hip pain.  Weakness Associated symptoms include arthralgias. Pertinent negatives include no abdominal pain, chest pain, chills, fatigue, fever, nausea, neck pain, vomiting or weakness.  Hip Pain Associated symptoms include arthralgias. Pertinent negatives include no abdominal pain, chest pain, chills, fatigue, fever, nausea, neck pain, vomiting or weakness.    Past Medical History  Diagnosis Date  . Vitamin D deficiency   . Insomnia, unspecified   . Other specified  cardiac dysrhythmias(427.89)   . Nausea with vomiting   . Dysphagia, oral phase   . Loss of weight   . Hyperpotassemia   . Dizziness and giddiness   . Chronic lymphocytic thyroiditis   . Other and unspecified hyperlipidemia   . Hypothyroidism   . Unspecified protein-calorie malnutrition   . Alzheimer's disease   . Malignant neoplasm of breast (female), unspecified site   . Osteoarthritis, knee   . Senile osteoporosis   . Benign hypertension   . Anxiety and depression    Past Surgical History  Procedure Laterality Date  . Breast lumpectomy  1995  . Eye surgery     Family History  Problem Relation Age of Onset  . Pneumonia Mother   . Heart attack Father   . Pneumonia Brother    History  Substance Use Topics  . Smoking status: Former Games developer  . Smokeless tobacco: Not on file  . Alcohol Use: No   OB History   Grav Para Term Preterm Abortions TAB SAB Ect Mult Living                 Review of Systems  Constitutional: Negative for fever, chills and fatigue.  HENT: Negative for trouble swallowing.   Eyes: Negative for visual disturbance.  Respiratory: Negative for shortness of breath.   Cardiovascular: Negative for chest pain and palpitations.  Gastrointestinal: Negative for nausea, vomiting, abdominal pain and diarrhea.  Genitourinary: Negative for dysuria and difficulty urinating.  Musculoskeletal: Positive for arthralgias. Negative for neck pain.  Skin: Negative for color change.  Neurological: Positive for seizures and syncope. Negative for dizziness and weakness.  Psychiatric/Behavioral: Negative for dysphoric mood.    Allergies  Review of patient's allergies indicates no known allergies.  Home Medications   Current Outpatient Rx  Name  Route  Sig  Dispense  Refill  . acetaminophen (TYLENOL) 500 MG tablet      Take one to two tablets by mouth up to three times daily for pain         . Cholecalciferol (VITAMIN D3 ADULT GUMMIES PO)   Oral   Take by mouth.  2 by mouth daily         . diclofenac sodium (VOLTAREN) 1 % GEL      Apply 4 grams four times daily to knees as needed for pain         . Dietary Management Product (AXONA PO)      3 tablespoons once daily to help preserve memory         . donepezil (ARICEPT) 5 MG tablet      Take one tablet in the morning and one tablet in the evening.   60 tablet   5    BP 148/84  Temp(Src) 97.8 F (36.6 C) (Oral)  Resp 18  Ht 5\' 2"  (1.575 m)  Wt 99 lb (44.906 kg)  BMI 18.10 kg/m2  SpO2 99% Physical Exam  Nursing note and vitals reviewed. Constitutional: She is oriented to person, place, and time. She appears well-developed and well-nourished. No distress.  Patient is cachectic.   HENT:  Head: Normocephalic and atraumatic.  Eyes: Conjunctivae are normal.  Neck: Normal range of motion.  Cardiovascular: Normal rate, regular rhythm and intact distal pulses.  Exam reveals no gallop and no friction rub.   No murmur heard. Pulmonary/Chest: Effort normal and breath sounds normal. She has no wheezes. She has no rales. She exhibits no tenderness.  Abdominal: Soft. She exhibits no distension. There is no tenderness. There is no rebound and no guarding.  Musculoskeletal: Normal range of motion.  Right lateral hip point tenderness to palpation. ROM slightly limited due to pain. No obvious deformity.   Neurological: She is alert and oriented to person, place, and time. Coordination normal.  Speech is goal-oriented. Moves limbs without ataxia.   Skin: Skin is warm and dry.  Psychiatric: She has a normal mood and affect. Her behavior is normal.    ED Course  Procedures (including critical care time) Labs Review Labs Reviewed  CBC WITH DIFFERENTIAL - Abnormal; Notable for the following:    Hemoglobin 11.8 (*)    RDW 15.7 (*)    Neutrophils Relative % 82 (*)    Lymphocytes Relative 11 (*)    All other components within normal limits  BASIC METABOLIC PANEL - Abnormal; Notable for the  following:    Glucose, Bld 104 (*)    BUN 30 (*)    GFR calc non Af Amer 58 (*)    GFR calc Af Amer 67 (*)    All other components within normal limits  URINALYSIS, ROUTINE W REFLEX MICROSCOPIC - Abnormal; Notable for the following:    APPearance CLOUDY (*)    Hgb urine dipstick MODERATE (*)    Leukocytes, UA MODERATE (*)    All other components within normal limits  URINE MICROSCOPIC-ADD ON - Abnormal; Notable for the following:    Squamous Epithelial / LPF MANY (*)    Bacteria, UA MANY (*)    All other components within normal limits  COMPREHENSIVE METABOLIC PANEL - Abnormal; Notable for the following:    BUN 30 (*)    Albumin 2.8 (*)  GFR calc non Af Amer 54 (*)    GFR calc Af Amer 62 (*)    All other components within normal limits  CBC - Abnormal; Notable for the following:    RBC 3.64 (*)    Hemoglobin 10.2 (*)    HCT 32.4 (*)    RDW 15.6 (*)    All other components within normal limits  URINE CULTURE  T4, FREE  TROPONIN I  TROPONIN I  TROPONIN I  TSH  VITAMIN B12  POCT I-STAT TROPONIN I   Imaging Review Dg Hip Complete Right  09/22/2013   CLINICAL DATA:  Weakness and pain right hip  EXAM: RIGHT HIP - COMPLETE 2+ VIEW  COMPARISON:  None.  FINDINGS: The pelvic bones are intact. There is no evidence of fracture or dislocation involving proximal right femur. There is mild right hip joint narrowing and osteophyte formation. There are no femoral head contour deformities noted on the right.  IMPRESSION: Mild arthritis   Electronically Signed   By: Esperanza Heir M.D.   On: 09/22/2013 16:48   Ct Head Wo Contrast  09/22/2013   CLINICAL DATA:  Possible seizure.  EXAM: CT HEAD WITHOUT CONTRAST  TECHNIQUE: Contiguous axial images were obtained from the base of the skull through the vertex without intravenous contrast.  COMPARISON:  Head CT scan 11/18/2009.  FINDINGS: There is chronic microvascular ischemic change and marked atrophy. No evidence of acute abnormality including  infarction, hemorrhage, mass lesion, mass effect, midline shift or abnormal extra-axial fluid collection is identified. There is no hydrocephalus or pneumocephalus. The calvarium is intact.  IMPRESSION: No acute finding.   Electronically Signed   By: Drusilla Kanner M.D.   On: 09/22/2013 12:51    EKG Interpretation    Date/Time:    Ventricular Rate:    PR Interval:    QRS Duration:   QT Interval:    QTC Calculation:   R Axis:     Text Interpretation:              MDM   1. Syncope   2. Right hip pain   3. Alzheimer's disease   4. Anxiety and depression   5. Dysphagia   6. Hypothyroidism   7. OA (osteoarthritis) of hip     12:15 PM Labs, urinalysis, CT head and right hip xray pending. Vitals stable and patient afebrile. Patient will have small dose of toradol for pain due to narcotic intolerance.   Head CT, urinalysis and labs unremarkable for acute changes. Patient will be admitted to hospitalist for syncope.   Emilia Beck, [place] 09/23/13 [phone]

## 2013-09-22 NOTE — H&P (Signed)
[facility] History and Physical  Theresa Rodriguez RUE:[id number] DOB: 12-Apr-1922 DOA: 09/22/2013  Referring physician: Emilia Beck PA-C PCP: Bufford Spikes, DO   Chief Complaint: syncope and right hip pain  HPI: Theresa Rodriguez is a 79 y.o. female with PMH significant for HTN (treated by diet at this moment), hypothyroidism, OA, osteoporosis and dementia; came to ED secondary to sudden episode of right hip pain and 2 episodes of syncope. Per family members one of the syncope events associated with jerking movements, bowel/urine incontinence and post-ictal state. Patient's family reports some intermittent episodes of CP and SOB over the last weeks (but none today). Patient denies fever, chills,  Dysuria, nausea, vomiting, HA's, blurred vision or any other acute complaints.   Review of Systems:  Negative except as otherwise mentioned on HPI.  Past Medical History  Diagnosis Date  . Vitamin D deficiency   . Insomnia, unspecified   . Other specified cardiac dysrhythmias(427.89)   . Nausea with vomiting   . Dysphagia, oral phase   . Loss of weight   . Hyperpotassemia   . Dizziness and giddiness   . Chronic lymphocytic thyroiditis   . Other and unspecified hyperlipidemia   . Hypothyroidism   . Unspecified protein-calorie malnutrition   . Alzheimer's disease   . Malignant neoplasm of breast (female), unspecified site   . Osteoarthritis, knee   . Senile osteoporosis   . Benign hypertension   . Anxiety and depression    Past Surgical History  Procedure Laterality Date  . Breast lumpectomy  1995  . Eye surgery     Social History:  reports that she has quit smoking. She does not have any smokeless tobacco history on file. She reports that she does not drink alcohol or use illicit drugs.   No Known Allergies  Family History  Problem Relation Age of Onset  . Pneumonia Mother   . Heart attack Father   . Pneumonia Brother     Prior to Admission medications    Medication Sig Start Date End Date Taking? Authorizing Provider  acetaminophen (TYLENOL) 500 MG tablet Take one to two tablets by mouth up to three times daily for pain   Yes Historical Provider, MD  Cholecalciferol (VITAMIN D3 ADULT GUMMIES PO) Take by mouth. 2 by mouth daily   Yes Historical Provider, MD  diclofenac sodium (VOLTAREN) 1 % GEL Apply 4 grams four times daily to knees as needed for pain   Yes Historical Provider, MD  Dietary Management Product (AXONA PO) 3 tablespoons once daily to help preserve memory   Yes Historical Provider, MD  donepezil (ARICEPT) 5 MG tablet Take one tablet in the morning and one tablet in the evening. 05/06/13  Yes Claudie Revering, NP   Physical Exam: Filed Vitals:   09/22/13 1600  BP: 117/64  Pulse:   Temp:   Resp: 22     General:  AAOX1, currently denies any major complaints. Reports pain in right hip  Eyes: PERRL, no icterus, EOMI  ENT: dry MM, no exudates or erythema inside her mouth, no drainage out of ears or nostrils  Neck: no JVD, no bruits  Cardiovascular: S1 and S2, no rubs or gallops, Soft SEM  Respiratory: no wheezing, no crackles or rhonchi; good air movement  Abdomen: soft, NT, ND, positive BS; no guarding.  Skin: no rash or petechiae  Musculoskeletal: decrease range of motion on her right hip (adduction and abduction); no erythema, swelling or any other joint complaints at this moment   Psychiatric:  no hallucinations; poor insight due to dementia  Neurologic: AAOX1, move 4 limbs spontaneously; CN intact, no facial droop, no pronation and patient w/o focal deficit  Labs on Admission:  Basic Metabolic Panel:  Recent Labs Lab 09/22/13 1125  NA 137  K 4.0  CL 100  CO2 28  GLUCOSE 104*  BUN 30*  CREATININE 0.86  CALCIUM 9.3   CBC:  Recent Labs Lab 09/22/13 1125  WBC 9.2  NEUTROABS 7.6  HGB 11.8*  HCT 36.7  MCV 89.3  PLT 232   Radiological Exams on Admission: Dg Hip Complete Right  09/22/2013    CLINICAL DATA:  Weakness and pain right hip  EXAM: RIGHT HIP - COMPLETE 2+ VIEW  COMPARISON:  None.  FINDINGS: The pelvic bones are intact. There is no evidence of fracture or dislocation involving proximal right femur. There is mild right hip joint narrowing and osteophyte formation. There are no femoral head contour deformities noted on the right.  IMPRESSION: Mild arthritis   Electronically Signed   By: Esperanza Heir M.D.   On: 09/22/2013 16:48   Ct Head Wo Contrast  09/22/2013   CLINICAL DATA:  Possible seizure.  EXAM: CT HEAD WITHOUT CONTRAST  TECHNIQUE: Contiguous axial images were obtained from the base of the skull through the vertex without intravenous contrast.  COMPARISON:  Head CT scan 11/18/2009.  FINDINGS: There is chronic microvascular ischemic change and marked atrophy. No evidence of acute abnormality including infarction, hemorrhage, mass lesion, mass effect, midline shift or abnormal extra-axial fluid collection is identified. There is no hydrocephalus or pneumocephalus. The calvarium is intact.  IMPRESSION: No acute finding.   Electronically Signed   By: Drusilla Kanner M.D.   On: 09/22/2013 12:51    EKG:  Sinus, no acute ischemic process.  Assessment/Plan 1-Syncope: with episode of eye rolling, jerking movements and urine/bowel incontinence. Differential includes seizure, vasovagal and decrease perfusion with orthostasis (patient started walking after been sitting down and pass out). -after discussing with family options of treatment and goals of care; they are in agreement with non-invasive studies. (CT head in ED negative) -will admit to telemetry, check TSH, B12, 2-D echo (patient has been complaining in the last couple of weeks of CP and SOB; at that time presumed to be anxiety) and EEG -will ask palliative care to meet with patient and family as they are interested in discussing advance directives and probably home hospice -will use low dose ativan and roxanol (patient  naive to this medications) as needed for comfort, agitation and pain -will r/o UTI -will check orthostatic VS and give IVF's resuscitation  2-Alzheimer's disease: continue supportive care and the use of aricept.   3-Hypothyroidism: currently not taking any supplementation. Will check TSH and Free T4  4-right hip OA (osteoarthritis): will use ice pads, ketoralac and topical voltaren for pain control. -PT ordered for eval and recommendations -no fx's on x-ray  5-Dysphagia: will continue dysphagia 3 and thin liquids.  DVT: lovenox.   Palliative care has been consulted   Code Status: DNR Family Communication: daughter and granddaughters at bedside  Disposition Plan: observation, LOS < 2 midnights, telemetry  Time spent: 50 minutes with > 30 minutes discussing treatment with patient and family members  Theresa Rodriguez [facility] Pager [phone]  If 7PM-7AM, please contact night-coverage [email or web address] Password [facility] 09/22/2013, 5:05 PM

## 2013-09-23 ENCOUNTER — Other Ambulatory Visit: Payer: Medicare Other

## 2013-09-23 ENCOUNTER — Observation Stay (HOSPITAL_COMMUNITY): Payer: Medicare Other

## 2013-09-23 DIAGNOSIS — I359 Nonrheumatic aortic valve disorder, unspecified: Secondary | ICD-10-CM

## 2013-09-23 DIAGNOSIS — M25559 Pain in unspecified hip: Secondary | ICD-10-CM

## 2013-09-23 LAB — COMPREHENSIVE METABOLIC PANEL
ALT: 11 U/L (ref 0–35)
AST: 15 U/L (ref 0–37)
CO2: 26 mEq/L (ref 19–32)
Calcium: 8.5 mg/dL (ref 8.4–10.5)
Chloride: 105 mEq/L (ref 96–112)
GFR calc non Af Amer: 54 mL/min — ABNORMAL LOW (ref >90)
Sodium: 139 mEq/L (ref 135–145)
Total Protein: 6.4 g/dL (ref 6.0–8.3)

## 2013-09-23 LAB — CBC
HCT: 32.4 % — ABNORMAL LOW (ref 36.0–46.0)
Hemoglobin: 10.2 g/dL — ABNORMAL LOW (ref 12.0–15.0)
MCH: 28 pg (ref 26.0–34.0)
MCHC: 31.5 g/dL (ref 30.0–36.0)
MCV: 89 fL (ref 78.0–100.0)
RDW: 15.6 % — ABNORMAL HIGH (ref 11.5–15.5)

## 2013-09-23 LAB — TROPONIN I: Troponin I: <0.3 ng/mL (ref <0.30)

## 2013-09-23 LAB — VITAMIN B12: Vitamin B-12: 868 pg/mL (ref 211–911)

## 2013-09-23 LAB — TSH: TSH: 3.07 u[IU]/mL (ref 0.350–4.500)

## 2013-09-23 LAB — URINE CULTURE: Special Requests: NORMAL

## 2013-09-23 LAB — T4, FREE: Free T4: 0.94 ng/dL (ref 0.80–1.80)

## 2013-09-23 MED ORDER — ENOXAPARIN SODIUM 30 MG/0.3ML ~~LOC~~ SOLN
20.0000 mg | Freq: Every day | SUBCUTANEOUS | Status: DC
  Filled 2013-09-23: qty 0.2

## 2013-09-23 MED ORDER — CIPROFLOXACIN HCL 250 MG PO TABS: 250.0000 mg | ORAL_TABLET | Freq: Two times a day (BID) | ORAL | Status: DC

## 2013-09-23 MED ORDER — CIPROFLOXACIN HCL 250 MG PO TABS
250.0000 mg | ORAL_TABLET | Freq: Two times a day (BID) | ORAL | Status: DC
  Administered 2013-09-23: 250 mg via ORAL
  Filled 2013-09-23 (×2): qty 1

## 2013-09-23 NOTE — Progress Notes (Signed)
Patient ID: Theresa Rodriguez  female  WUJ:[id number]    DOB: 07/21/1922    DOA: 09/22/2013  PCP: Bufford Spikes, DO  Assessment/Plan: Principal Problem:   Syncope: Possibly vasovagal are triggered by the right hip pain. Upon discussion with patient's daughter in detail, she states seizure-like activity. The syncope episodes x2 were witnessed by the daughter. - Syncope workup in progress, 2-D echo done showed EF of 55-60%, no regional wall motion abnormalities, no valvular issues - EEG done Normal  - Ruled out her acute ACS, troponin x3 negative - B12 TSH within normal limits   Active Problems:   Alzheimer's disease: Stable    OA (osteoarthritis) of hip - Right hip x-ray showed mild arthritis, pain resolved after receiving Toradol yesterday - She has followed [facility] in the past, recommended followup outpatient with them currently no acute issues  Mild UTI - UA is suggestive of UTI, mild, urine culture shows 80,000 colonies. Unclear what triggered patient's syncopal episode, will treat it with ciprofloxacin 250 mg for 3 days   DVT Prophylaxis:  Code Status:  Disposition:Await physical therapy evaluation, hopefully DC home today if okay with neurology  Family communication: Discussed with patient's daughter, Olegario Messier and her granddaughter.   Subjective: Patient now feels fine and actually got up from the bed and walk to the wheelchair for the testing   Objective: Weight change:  No intake or output data in the 24 hours ending 09/23/13 1310 Blood pressure 151/74, pulse 74, temperature 98.4 F (36.9 C), temperature source Oral, resp. rate 18, height 5\' 2"  (1.575 m), weight 41.958 kg (92 lb 8 oz), SpO2 99.00%.  Physical Exam: General: Alert and awake, oriented x3, not in any acute distress. CVS: S1-S2 clear, no murmur rubs or gallops Chest: clear to auscultation bilaterally, no wheezing, rales or rhonchi Abdomen: soft nontender, nondistended, normal bowel sounds   Extremities: no cyanosis, clubbing or edema noted bilaterally   Lab Results: Basic Metabolic Panel:  Recent Labs Lab 09/22/13 1125 09/23/13 0530  NA 137 139  K 4.0 3.7  CL 100 105  CO2 28 26  GLUCOSE 104* 89  BUN 30* 30*  CREATININE 0.86 0.92  CALCIUM 9.3 8.5   Liver Function Tests:  Recent Labs Lab 09/23/13 0530  AST 15  ALT 11  ALKPHOS 61  BILITOT 0.5  PROT 6.4  ALBUMIN 2.8*   No results found for this basename: LIPASE, AMYLASE,  in the last 168 hours No results found for this basename: AMMONIA,  in the last 168 hours CBC:  Recent Labs Lab 09/22/13 1125 09/23/13 0530  WBC 9.2 6.5  NEUTROABS 7.6  --   HGB 11.8* 10.2*  HCT 36.7 32.4*  MCV 89.3 89.0  PLT 232 220   Cardiac Enzymes:  Recent Labs Lab 09/22/13 1825 09/22/13 2350 09/23/13 0530  TROPONINI <0.30 <0.30 <0.30   BNP: No components found with this basename: POCBNP,  CBG: No results found for this basename: GLUCAP,  in the last 168 hours   Micro Results: Recent Results (from the past 240 hour(s))  URINE CULTURE     Status: None   Collection Time    09/22/13 12:05 PM      Result Value Range Status   Specimen Description URINE, RANDOM   Final   Special Requests Normal   Final   Culture  Setup Time     Final   Value: 09/22/2013 16:50     Performed at [facility] Count  Final   Value: 80,000 COLONIES/ML     Performed at [facility]   Culture     Final   Value: Multiple bacterial morphotypes present, none predominant. Suggest appropriate recollection if clinically indicated.     Performed at [facility]   Report Status 09/23/2013 FINAL   Final    Studies/Results: Dg Hip Complete Right  09/22/2013   CLINICAL DATA:  Weakness and pain right hip  EXAM: RIGHT HIP - COMPLETE 2+ VIEW  COMPARISON:  None.  FINDINGS: The pelvic bones are intact. There is no evidence of fracture or dislocation involving proximal right femur. There is mild right hip joint  narrowing and osteophyte formation. There are no femoral head contour deformities noted on the right.  IMPRESSION: Mild arthritis   Electronically Signed   By: Esperanza Heir M.D.   On: 09/22/2013 16:48   Ct Head Wo Contrast  09/22/2013   CLINICAL DATA:  Possible seizure.  EXAM: CT HEAD WITHOUT CONTRAST  TECHNIQUE: Contiguous axial images were obtained from the base of the skull through the vertex without intravenous contrast.  COMPARISON:  Head CT scan 11/18/2009.  FINDINGS: There is chronic microvascular ischemic change and marked atrophy. No evidence of acute abnormality including infarction, hemorrhage, mass lesion, mass effect, midline shift or abnormal extra-axial fluid collection is identified. There is no hydrocephalus or pneumocephalus. The calvarium is intact.  IMPRESSION: No acute finding.   Electronically Signed   By: Drusilla Kanner M.D.   On: 09/22/2013 12:51    Medications: Scheduled Meds: . diclofenac sodium  2 g Topical QID  . donepezil  5 mg Oral QHS  . enoxaparin (LOVENOX) injection  20 mg Subcutaneous QHS  . sodium chloride  3 mL Intravenous Q12H      LOS: 1 day   RAI,RIPUDEEP M.D. [facility] 09/23/2013, 1:10 PM Pager: [phone]  If 7PM-7AM, please contact night-coverage [email or web address] Password [email or web address]

## 2013-09-23 NOTE — Progress Notes (Signed)
Echo Lab  2D Echocardiogram completed.  Theresa Rodriguez, RDCS 09/23/2013 11:22 AM

## 2013-09-23 NOTE — Evaluation (Addendum)
Physical Therapy Evaluation Patient Details Name: Theresa Rodriguez MRN: [id number] DOB: [birth year] Today's Date: 09/23/2013 Time: 7829-5621 PT Time Calculation (min): 13 min  PT Assessment / Plan / Recommendation History of Present Illness  Theresa Rodriguez is a 79 y.o. female with PMH significant for HTN (treated by diet at this moment), hypothyroidism, OA, osteoporosis and dementia; came to ED secondary to sudden episode of right hip pain and 2 episodes of syncope. Per family members one of the syncope events associated with jerking movements, bowel/urine incontinence and post-ictal state.  Clinical Impression  Pt with some cognitive impairment at baseline. She reported that her hip pain is gone, but then stated that it hurt a little bit while she was walking, but it did not interfere with gait.  She is independent in gait without a device. Educated pt and granddaughter that pt "slow down" a little especially when in crowds or walking on uneven surfaces. Pt resistant to any assistive device and does not need one for walking on level, uncrowded situations.    PT Assessment  Patent does not need any further PT services    Follow Up Recommendations  No PT follow up    Does the patient have the potential to tolerate intense rehabilitation      Barriers to Discharge        Equipment Recommendations  None recommended by PT    Recommendations for Other Services     Frequency      Precautions / Restrictions     Pertinent Vitals/Pain Only minor complaints of pain      Mobility  Bed Mobility Bed Mobility: Supine to Sit;Sit to Supine Supine to Sit: 7: Independent Sit to Supine: 7: Independent Transfers Transfers: Sit to Stand;Stand to Sit Sit to Stand: 7: Independent Stand to Sit: 7: Independent Ambulation/Gait Ambulation/Gait Assistance: 7: Independent Ambulation Distance (Feet): 125 Feet Assistive device: None Gait Pattern: Within Functional Limits;Trunk flexed Gait  velocity: pt self selects functional pace Stairs: Yes Stairs Assistance: 6: Modified independent (Device/Increase time) Stair Management Technique: One rail Right;Alternating pattern Number of Stairs: 5 Wheelchair Mobility Wheelchair Mobility: No    Exercises     PT Diagnosis:    PT Problem List:   PT Treatment Interventions:       PT Goals(Current goals can be found in the care plan section) Acute Rehab PT Goals PT Goal Formulation: No goals set, d/c therapy  Visit Information  Last PT Received On: 09/23/13 Assistance Needed: +1 History of Present Illness: Theresa Rodriguez is a 79 y.o. female with PMH significant for HTN (treated by diet at this moment), hypothyroidism, OA, osteoporosis and dementia; came to ED secondary to sudden episode of right hip pain and 2 episodes of syncope. Per family members one of the syncope events associated with jerking movements, bowel/urine incontinence and post-ictal state.       Prior Functioning  Home Living Family/patient expects to be discharged to:: Private residence Living Arrangements: Spouse/significant other;Other relatives Available Help at Discharge: Family Type of Home: House Home Layout: Two level Home Equipment: None Prior Function Level of Independence: Independent Communication Communication: No difficulties    Cognition  Cognition Arousal/Alertness: Awake/alert Behavior During Therapy: WFL for tasks assessed/performed Overall Cognitive Status: History of cognitive impairments - at baseline    Extremity/Trunk Assessment Lower Extremity Assessment Lower Extremity Assessment: Overall WFL for tasks assessed (pt with occasional minor complaints of right hip pain) Cervical / Trunk Assessment Cervical / Trunk Assessment: Other exceptions;Kyphotic Cervical / Trunk Exceptions: pt is very  thin with diffuse muscle atrophy   Balance Balance Balance Assessed: Yes Standardized Balance Assessment Standardized Balance  Assessment: Dynamic Gait Index Dynamic Gait Index Level Surface: Normal Change in Gait Speed: Normal Gait with Horizontal Head Turns: Mild Impairment Gait with Vertical Head Turns: Mild Impairment Gait and Pivot Turn: Normal Step Over Obstacle: Normal Step Around Obstacles: Normal Steps: Normal Total Score: 22 High Level Balance High Level Balance Comments: pt able to stand for 10 seconds with eyes closed with no balance impairment  End of Session PT - End of Session Activity Tolerance: Patient tolerated treatment well Patient left: in bed;with family/visitor present Nurse Communication: Mobility status  GP Functional Assessment Tool Used: Dynamic Gait index Functional Limitation: Mobility: Walking and moving around Mobility: Walking and Moving Around Current Status (U9811): At least 1 percent but less than 20 percent impaired, limited or restricted Mobility: Walking and Moving Around Goal Status [phone]): At least 1 percent but less than 20 percent impaired, limited or restricted Mobility: Walking and Moving Around Discharge Status [phone]): At least 1 percent but less than 20 percent impaired, limited or restricted   Rosey Bath K. Manson Passey, PT 09/23/2013, 1:36 PM

## 2013-09-23 NOTE — Progress Notes (Signed)
EEG report.  Brief clinical history: 78 y/o with AD and 2 witnessed episodes of transient LOC with yes rolling back and incontinence.  No prior history of frank epileptic seizures. Syncope versus seizure.  Technique: this is a 17 channel routine scalp EEG performed at the bedside with bipolar and monopolar montages arranged in accordance to the international 10/20 system of electrode placement. One channel was dedicated to EKG recording.  The study was performed during wakefulness, drowsiness, and stage 2 sleep. No activating procedures performed.  Description:In the wakeful state, the best background consisted of a medium amplitude, posterior dominant, well sustained, symmetric and reactive 9 Hz rhythm. Drowsiness demonstrated dropout of the alpha rhythm. Stage 2 sleep showed symmetric and synchronous sleep spindles without intermixed epileptiform discharges.  No focal or generalized epileptiform discharges noted.  No slowing seen.  EKG showed sinus rhythm.  Impression: this is a normal awake and asleep EEG. Please, be aware that a normal EEG does not exclude the possibility of epilepsy.  Clinical correlation is advised.  Wyatt Portela, MD

## 2013-09-23 NOTE — Consult Note (Signed)
NEURO HOSPITALIST CONSULT NOTE    Reason for Consult: syncope versus seizure  HPI:                                                                                                                                          Theresa Rodriguez is an 79 y.o. female with known alzheimer dementia, syncope precipitated by nemenda (stopped due to syncope).  Patient has chronic left hip pain.  Yesterday morning patient walked to her daughters room (she lives with family due to end stage dementia) to get help.  Her daughter walked her back to her bed and upon getting to her bed, patient was noted to slowly slump froward then have brief -30 seconds period of jerking and decreased mental status.  There was no post ictal phase.  Later that morning patient was on the commode.  Her daughter was helping her up, she patient had left hip pain and then was noted to have a brief peroid of LOC with jerking and fecal incontinence. Again no post ictal phase noted.  Currently she is back to baseline. She has no history of seizure in the past. No medications have been changed other than her BP medications recently were stopped due to patient being normotensive.   Past Medical History  Diagnosis Date  . Vitamin D deficiency   . Insomnia, unspecified   . Other specified cardiac dysrhythmias(427.89)   . Nausea with vomiting   . Dysphagia, oral phase   . Loss of weight   . Hyperpotassemia   . Dizziness and giddiness   . Chronic lymphocytic thyroiditis   . Other and unspecified hyperlipidemia   . Hypothyroidism   . Unspecified protein-calorie malnutrition   . Osteoarthritis, knee   . Senile osteoporosis   . Anxiety and depression   . Breast cancer     "right" (09/22/2013)  . Benign hypertension     "took off BP meds this fall" (09/22/2013)  . Alzheimer's disease     "probably moderate" (09/22/2013)  . Anxiety   . Stroke     "might have had one today; not sure" (09/22/2013)  . Seizures     "might  have had one today; not sure" (09/22/2013    Past Surgical History  Procedure Laterality Date  . Eye surgery    . Knee surgery Right 1940's    "cyst on her knee" (09/22/2013)  . Breast lumpectomy Right 1995    Family History  Problem Relation Age of Onset  . Pneumonia Mother   . Heart attack Father   . Pneumonia Brother      Social History:  reports that she has quit smoking. Her smoking use included Cigarettes. She has a 20 pack-year smoking history. She has never used smokeless tobacco. She reports that she  drinks about 4.2 ounces of alcohol per week. She reports that she does not use illicit drugs.  No Known Allergies  MEDICATIONS:                                                                                                                     Scheduled: . ciprofloxacin  250 mg Oral BID  . diclofenac sodium  2 g Topical QID  . donepezil  5 mg Oral QHS  . enoxaparin (LOVENOX) injection  20 mg Subcutaneous QHS  . sodium chloride  3 mL Intravenous Q12H     ROS:                                                                                                                                       History obtained from daughter  General ROS: negative for - chills, fatigue, fever, night sweats, weight gain or weight loss Psychological ROS: negative for - behavioral disorder, hallucinations, memory difficulties, mood swings or suicidal ideation Ophthalmic ROS: negative for - blurry vision, double vision, eye pain or loss of vision ENT ROS: negative for - epistaxis, nasal discharge, oral lesions, sore throat, tinnitus or vertigo Allergy and Immunology ROS: negative for - hives or itchy/watery eyes Hematological and Lymphatic ROS: negative for - bleeding problems, bruising or swollen lymph nodes Endocrine ROS: negative for - galactorrhea, hair pattern changes, polydipsia/polyuria or temperature intolerance Respiratory ROS: negative for - cough, hemoptysis, shortness of breath or  wheezing Cardiovascular ROS: negative for - chest pain, dyspnea on exertion, edema or irregular heartbeat Gastrointestinal ROS: negative for - abdominal pain, diarrhea, hematemesis, nausea/vomiting or stool incontinence Genito-Urinary ROS: negative for - dysuria, hematuria, incontinence or urinary frequency/urgency Musculoskeletal ROS: negative for - joint swelling or muscular weakness Neurological ROS: as noted in HPI Dermatological ROS: negative for rash and skin lesion changes   Blood pressure 151/74, pulse 74, temperature 98.4 F (36.9 C), temperature source Oral, resp. rate 18, height 5\' 2"  (1.575 m), weight 41.958 kg (92 lb 8 oz), SpO2 99.00%.   Neurologic Examination:  Mental Status: Alert, oriented to place but not month or year (known dementia), thought content appropriate.  Speech fluent without evidence of aphasia.  Able to follow 3 step commands without difficulty. Cranial Nerves: II: Discs flat bilaterally; Visual fields grossly normal, pupils equal, round, reactive to light and accommodation III,IV, VI: ptosis not present, extra-ocular motions intact bilaterally V,VII: smile symmetric, facial light touch sensation normal bilaterally VIII: hearing normal bilaterally IX,X: gag reflex present XI: bilateral shoulder shrug XII: midline tongue extension without atrophy or fasciculations  Motor: Right : Upper extremity   5/5    Left:     Upper extremity   5/5  Lower extremity   5/5     Lower extremity   5/5 Tone and bulk:normal tone throughout; no atrophy noted Sensory: Pinprick and light touch intact throughout, bilaterally Deep Tendon Reflexes:  Right: Upper Extremity   Left: Upper extremity   biceps (C-5 to C-6) 2/4   biceps (C-5 to C-6) 2/4 tricep (C7) 2/4    triceps (C7) 2/4 Brachioradialis (C6) 2/4  Brachioradialis (C6) 2/4  Lower Extremity Lower Extremity  quadriceps  (L-2 to L-4) 2/4   quadriceps (L-2 to L-4) 2/4 Achilles (S1) 1/4   Achilles (S1) 1/4  Plantars: Right: mute   Left: upgoing Cerebellar: normal finger-to-nose,  normal heel-to-shin test Gait: not assessed  CV: pulses palpable throughout    No results found for this basename: cbc, bmp, coags, chol, tri, ldl, hga1c    Results for orders placed during the hospital encounter of 09/22/13 (from the past 48 hour(s))  CBC WITH DIFFERENTIAL     Status: Abnormal   Collection Time    09/22/13 11:25 AM      Result Value Range   WBC 9.2  4.0 - 10.5 K/uL   RBC 4.11  3.87 - 5.11 MIL/uL   Hemoglobin 11.8 (*) 12.0 - 15.0 g/dL   HCT 29.5  62.1 - 30.8 %   MCV 89.3  78.0 - 100.0 fL   MCH 28.7  26.0 - 34.0 pg   MCHC 32.2  30.0 - 36.0 g/dL   RDW 65.7 (*) 84.6 - 96.2 %   Platelets 232  150 - 400 K/uL   Neutrophils Relative % 82 (*) 43 - 77 %   Neutro Abs 7.6  1.7 - 7.7 K/uL   Lymphocytes Relative 11 (*) 12 - 46 %   Lymphs Abs 1.0  0.7 - 4.0 K/uL   Monocytes Relative 7  3 - 12 %   Monocytes Absolute 0.6  0.1 - 1.0 K/uL   Eosinophils Relative 0  0 - 5 %   Eosinophils Absolute 0.0  0.0 - 0.7 K/uL   Basophils Relative 0  0 - 1 %   Basophils Absolute 0.0  0.0 - 0.1 K/uL  BASIC METABOLIC PANEL     Status: Abnormal   Collection Time    09/22/13 11:25 AM      Result Value Range   Sodium 137  135 - 145 mEq/L   Potassium 4.0  3.5 - 5.1 mEq/L   Chloride 100  96 - 112 mEq/L   CO2 28  19 - 32 mEq/L   Glucose, Bld 104 (*) 70 - 99 mg/dL   BUN 30 (*) 6 - 23 mg/dL   Creatinine, Ser 9.52  0.50 - 1.10 mg/dL   Calcium 9.3  8.4 - 84.1 mg/dL   GFR calc non Af Amer 58 (*) >90 mL/min   GFR calc Af Amer 67 (*) >90 mL/min  Comment: (NOTE)     The eGFR has been calculated using the CKD EPI equation.     This calculation has not been validated in all clinical situations.     eGFR's persistently <90 mL/min signify possible Chronic Kidney     Disease.  POCT I-STAT TROPONIN I     Status: None   Collection Time     09/22/13 11:40 AM      Result Value Range   Troponin i, poc 0.01  0.00 - 0.08 ng/mL   Comment 3            Comment: Due to the release kinetics of cTnI,     a negative result within the first hours     of the onset of symptoms does not rule out     myocardial infarction with certainty.     If myocardial infarction is still suspected,     repeat the test at appropriate intervals.  URINALYSIS, ROUTINE W REFLEX MICROSCOPIC     Status: Abnormal   Collection Time    09/22/13 12:04 PM      Result Value Range   Color, Urine YELLOW  YELLOW   APPearance CLOUDY (*) CLEAR   Specific Gravity, Urine 1.017  1.005 - 1.030   pH 5.5  5.0 - 8.0   Glucose, UA NEGATIVE  NEGATIVE mg/dL   Hgb urine dipstick MODERATE (*) NEGATIVE   Bilirubin Urine NEGATIVE  NEGATIVE   Ketones, ur NEGATIVE  NEGATIVE mg/dL   Protein, ur NEGATIVE  NEGATIVE mg/dL   Urobilinogen, UA 0.2  0.0 - 1.0 mg/dL   Nitrite NEGATIVE  NEGATIVE   Leukocytes, UA MODERATE (*) NEGATIVE  URINE MICROSCOPIC-ADD ON     Status: Abnormal   Collection Time    09/22/13 12:04 PM      Result Value Range   Squamous Epithelial / LPF MANY (*) RARE   WBC, UA 3-6  <3 WBC/hpf   RBC / HPF 3-6  <3 RBC/hpf   Bacteria, UA MANY (*) RARE  URINE CULTURE     Status: None   Collection Time    09/22/13 12:05 PM      Result Value Range   Specimen Description URINE, RANDOM     Special Requests Normal     Culture  Setup Time       Value: 09/22/2013 16:50     Performed at [facility] Count       Value: 80,000 COLONIES/ML     Performed at [facility]   Culture       Value: Multiple bacterial morphotypes present, none predominant. Suggest appropriate recollection if clinically indicated.     Performed at [facility]   Report Status 09/23/2013 FINAL    T4, FREE     Status: None   Collection Time    09/22/13  6:25 PM      Result Value Range   Free T4 0.94  0.80 - 1.80 ng/dL   Comment: Performed at [facility]  TROPONIN I     Status: None   Collection Time    09/22/13  6:25 PM      Result Value Range   Troponin I <0.30  <0.30 ng/mL   Comment:            Due to the release kinetics of cTnI,     a negative result within the first hours     of the onset of symptoms does not  rule out     myocardial infarction with certainty.     If myocardial infarction is still suspected,     repeat the test at appropriate intervals.  TSH     Status: None   Collection Time    09/22/13  6:25 PM      Result Value Range   TSH 3.070  0.350 - 4.500 uIU/mL   Comment: Performed at [facility]  VITAMIN B12     Status: None   Collection Time    09/22/13  6:25 PM      Result Value Range   Vitamin B-12 868  211 - 911 pg/mL   Comment: Performed at [facility]  TROPONIN I     Status: None   Collection Time    09/22/13 11:50 PM      Result Value Range   Troponin I <0.30  <0.30 ng/mL   Comment:            Due to the release kinetics of cTnI,     a negative result within the first hours     of the onset of symptoms does not rule out     myocardial infarction with certainty.     If myocardial infarction is still suspected,     repeat the test at appropriate intervals.  TROPONIN I     Status: None   Collection Time    09/23/13  5:30 AM      Result Value Range   Troponin I <0.30  <0.30 ng/mL   Comment:            Due to the release kinetics of cTnI,     a negative result within the first hours     of the onset of symptoms does not rule out     myocardial infarction with certainty.     If myocardial infarction is still suspected,     repeat the test at appropriate intervals.  COMPREHENSIVE METABOLIC PANEL     Status: Abnormal   Collection Time    09/23/13  5:30 AM      Result Value Range   Sodium 139  135 - 145 mEq/L   Potassium 3.7  3.5 - 5.1 mEq/L   Chloride 105  96 - 112 mEq/L   CO2 26  19 - 32 mEq/L   Glucose, Bld 89  70 - 99 mg/dL   BUN 30 (*) 6 - 23 mg/dL   Creatinine,  Ser 4.40  0.50 - 1.10 mg/dL   Calcium 8.5  8.4 - 10.2 mg/dL   Total Protein 6.4  6.0 - 8.3 g/dL   Albumin 2.8 (*) 3.5 - 5.2 g/dL   AST 15  0 - 37 U/L   ALT 11  0 - 35 U/L   Alkaline Phosphatase 61  39 - 117 U/L   Total Bilirubin 0.5  0.3 - 1.2 mg/dL   GFR calc non Af Amer 54 (*) >90 mL/min   GFR calc Af Amer 62 (*) >90 mL/min   Comment: (NOTE)     The eGFR has been calculated using the CKD EPI equation.     This calculation has not been validated in all clinical situations.     eGFR's persistently <90 mL/min signify possible Chronic Kidney     Disease.  CBC     Status: Abnormal   Collection Time    09/23/13  5:30 AM      Result Value Range   WBC 6.5  4.0 - 10.5 K/uL   RBC 3.64 (*) 3.87 - 5.11 MIL/uL   Hemoglobin 10.2 (*) 12.0 - 15.0 g/dL   HCT 91.4 (*) 78.2 - 95.6 %   MCV 89.0  78.0 - 100.0 fL   MCH 28.0  26.0 - 34.0 pg   MCHC 31.5  30.0 - 36.0 g/dL   RDW 21.3 (*) 08.6 - 57.8 %   Platelets 220  150 - 400 K/uL    Dg Hip Complete Right  09/22/2013   CLINICAL DATA:  Weakness and pain right hip  EXAM: RIGHT HIP - COMPLETE 2+ VIEW  COMPARISON:  None.  FINDINGS: The pelvic bones are intact. There is no evidence of fracture or dislocation involving proximal right femur. There is mild right hip joint narrowing and osteophyte formation. There are no femoral head contour deformities noted on the right.  IMPRESSION: Mild arthritis   Electronically Signed   By: Esperanza Heir M.D.   On: 09/22/2013 16:48   Ct Head Wo Contrast  09/22/2013   CLINICAL DATA:  Possible seizure.  EXAM: CT HEAD WITHOUT CONTRAST  TECHNIQUE: Contiguous axial images were obtained from the base of the skull through the vertex without intravenous contrast.  COMPARISON:  Head CT scan 11/18/2009.  FINDINGS: There is chronic microvascular ischemic change and marked atrophy. No evidence of acute abnormality including infarction, hemorrhage, mass lesion, mass effect, midline shift or abnormal extra-axial fluid collection is  identified. There is no hydrocephalus or pneumocephalus. The calvarium is intact.  IMPRESSION: No acute finding.   Electronically Signed   By: Drusilla Kanner M.D.   On: 09/22/2013 12:51   EEG : Impression: this is a normal awake and asleep EEG. Please, be aware that a normal EEG does not exclude the possibility of epilepsy.  Clinical correlation is advised.  Wyatt Portela, MD  Echo Study Conclusions  - Left ventricle: The cavity size was normal. Wall thickness was normal. Systolic function was normal. The estimated ejection fraction was in the range of 55% to 60%. Wall motion was normal; there were no regional wall motion abnormalities. Doppler parameters are consistent with abnormal left ventricular relaxation (grade 1 diastolic dysfunction). The E/e' ratio is ~10, suggesting borderline elevated LV filling pressure. - Aortic valve: Trileaflet. Sclerosis without stenosis. Mild regurgitation. - Mitral valve: Mildly thickened leaflets . Mild regurgitation. - Left atrium: The atrium was normal in size. - Right atrium: The atrium was mildly dilated. - Tricuspid valve: Mild regurgitation. - Pulmonary arteries: PA peak pressure: 33mm Hg (S). - Inferior vena cava: The vessel was normal in size; the respirophasic diameter changes were in the normal range (= 50%); findings are consistent with normal central venous pressure. - Pericardium, extracardiac: There was no pericardial effusion.    Assessment and plan per attending neurologist  Felicie Morn PA-C [facility] [phone]  09/23/2013, 1:40 PM   Assessment/Plan:  79 YO female with known dementia who presented to ED with two episodes of convulsive syncope. EEG obtained showed no epileptiform activity or focal irritability. As this is her first event and description is that of syncope, would not initiate AED at this time.  That said, with history of dementia, if similar event were to reoccur after discharge, at  that time would recommend patient be seen as a out patient with neurology where AED would likely be initiated. No further neurological work up recommended while in hospital.   Patient seen and examined together with physician assistant and I concur with the assessment and plan.  Wyatt Portela, MD

## 2013-09-23 NOTE — Progress Notes (Signed)
Thank you for consulting the Palliative Medicine Team at [facility] to meet your patient's and family's needs.   The reason that you asked [place] to see your patient is  For  Clarification of GOC  We have scheduled your patient for a meeting:  Tentative for tomorrow morning at 0800 (await call back).  Family is working on logistics for all pertinent parties to be present.  Lorinda Creed NP  Palliative Medicine Team Team Phone # [phone] Pager [phone]

## 2013-09-23 NOTE — Progress Notes (Signed)
EEG Completed; Results Pending  

## 2013-09-23 NOTE — Progress Notes (Signed)
D/c orders received;IV removed with gauze on, pt remains stable condition, pt meds and instructions reviewed and given to pt and pt family; pt d/c to home

## 2013-09-23 NOTE — Care Management Note (Signed)
    Page 1 of 1   09/23/2013     3:35:48 PM   CARE MANAGEMENT NOTE 09/23/2013  Patient:  Theresa Rodriguez, Theresa Rodriguez   Account Number:  [id number]  Date Initiated:  09/23/2013  Documentation initiated by:  GRAVES-BIGELOW,Barnard Sharps  Subjective/Objective Assessment:   Pt admitted wiht syncope and plans to return home today.     Action/Plan:   CM will make referral for HHPT services. SOC to begin within 24-48 hours post d/c.   Anticipated DC Date:  09/23/2013   Anticipated DC Plan:  HOME W HOME HEALTH SERVICES      DC Planning Services  CM consult      [facility] Choice  HOME HEALTH   Choice offered to / List presented to:  C-1 Patient        HH arranged  HH-2 PT      [facility] agency  [facility]   Status of service:  Completed, signed off Medicare Important Message given?   (If response is "NO", the following Medicare IM given date fields will be blank) Date Medicare IM given:   Date Additional Medicare IM given:    Discharge Disposition:  HOME W HOME HEALTH SERVICES  Per UR Regulation:  Reviewed for med. necessity/level of care/duration of stay  If discussed at Long Length of Stay Meetings, dates discussed:    Comments:

## 2013-09-23 NOTE — Discharge Summary (Signed)
Physician Discharge Summary  Patient ID: Theresa Rodriguez MRN: [id number] DOB/AGE: 77-Sep-1923 77 y.o.  Admit date: 09/22/2013 Discharge date: 09/23/2013  Primary Care Physician:  Bufford Spikes, DO  Discharge Diagnoses:    . Syncope likely vasovagal  . Alzheimer's disease . Hypothyroidism . OA (osteoarthritis) of hip . Dysphagia  Consults:  Neurology, Dr. Cyril Mourning   Recommendations for Outpatient Follow-up:  If patient has any further syncopal episode with seizure activity, will need to see outpatient neurology and possible AED  Allergies:  No Known Allergies   Discharge Medications:   Medication List         acetaminophen 500 MG tablet  Commonly known as:  TYLENOL  Take one to two tablets by mouth up to three times daily for pain     AXONA PO  3 tablespoons once daily to help preserve memory     ciprofloxacin 250 MG tablet  Commonly known as:  CIPRO  Take 1 tablet (250 mg total) by mouth 2 (two) times daily. X 3 days     diclofenac sodium 1 % Gel  Commonly known as:  VOLTAREN  Apply 4 grams four times daily to knees as needed for pain     donepezil 5 MG tablet  Commonly known as:  ARICEPT  Take one tablet in the morning and one tablet in the evening.     VITAMIN D3 ADULT GUMMIES PO  Take by mouth. 2 by mouth daily         Brief H and P: For complete details please refer to admission H and P, but in brief Theresa Rodriguez is a 79 y.o. female with PMH significant for HTN (treated by diet at this moment), hypothyroidism, OA, osteoporosis and dementia; came to ED secondary to sudden episode of right hip pain and 2 episodes of syncope. Per family members one of the syncope events associated with jerking movements, bowel/urine incontinence and post-ictal state. Patient's family reported some intermittent episodes of CP and SOB over the last weeks (but none today). Patient denied fever, chills, Dysuria, nausea, vomiting, HA's, blurred vision or any other acute  complaints   Hospital Course:  Syncope: Possibly vasovagal are triggered by the right hip pain. Upon discussion with patient's daughter in detail, she reported seizure-like activity. The syncope episodes x2 were witnessed by the daughter. Syncope workup was initiated. Patient was admitted to telemetry floor, she did not have any acute arrhythmias. She was ruled out for acute ACS, troponins x3 were negative. 2-D echo done showed EF of 55-60%, no regional wall motion abnormalities, no valvular issues  EEG done Normal   B12 TSH within normal limits  Neurology was consulted due to seizure activity however EEG was normal. She has no history of seizures in the past. Patient was seen by Dr Cyril Mourning, who felt that this is her first event and description is more of a syncope hands no initiation of anti-epileptic medications at this time. However with history of dementia, if another similar event were to reoccur after discharge, at that time would recommend that patient see an outpatient neurologist where seizure medications would likely be initiated. Patient was seen by physical therapy and did not recommend any outpatient physical therapy, patient was at her baseline status.   Alzheimer's disease: Stable   OA (osteoarthritis) of hip  - Right hip x-ray showed mild arthritis, pain resolved after receiving Toradol yesterday  - She has followed [facility] in the past, recommended followup outpatient with them  - currently no acute issues ,  patient has been ambulating without any difficulty  Mild UTI  - UA is suggestive of UTI, mild, urine culture shows 80,000 colonies. Unclear what triggered patient's syncopal episode, will treat it with ciprofloxacin 250 mg for 3 days       Day of Discharge BP 116/60  Pulse 81  Temp(Src) 97.9 F (36.6 C) (Oral)  Resp 16  Ht 5\' 2"  (1.575 m)  Wt 41.958 kg (92 lb 8 oz)  BMI 16.91 kg/m2  SpO2 99%  Physical Exam: General: Alert and awake oriented x3  not in any acute distress. CVS: S1-S2 clear no murmur rubs or gallops Chest: clear to auscultation bilaterally, no wheezing rales or rhonchi Abdomen: soft nontender, nondistended, normal bowel sounds, no organomegaly Extremities: no cyanosis, clubbing or edema noted bilaterally Neuro: Cranial nerves II-XII intact, no focal neurological deficits   The results of significant diagnostics from this hospitalization (including imaging, microbiology, ancillary and laboratory) are listed below for reference.    LAB RESULTS: Basic Metabolic Panel:  Recent Labs Lab 10-14-13 1125 09/23/13 0530  NA 137 139  K 4.0 3.7  CL 100 105  CO2 28 26  GLUCOSE 104* 89  BUN 30* 30*  CREATININE 0.86 0.92  CALCIUM 9.3 8.5   Liver Function Tests:  Recent Labs Lab 09/23/13 0530  AST 15  ALT 11  ALKPHOS 61  BILITOT 0.5  PROT 6.4  ALBUMIN 2.8*   No results found for this basename: LIPASE, AMYLASE,  in the last 168 hours No results found for this basename: AMMONIA,  in the last 168 hours CBC:  Recent Labs Lab 14-Oct-2013 1125 09/23/13 0530  WBC 9.2 6.5  NEUTROABS 7.6  --   HGB 11.8* 10.2*  HCT 36.7 32.4*  MCV 89.3 89.0  PLT 232 220   Cardiac Enzymes:  Recent Labs Lab 10-14-2013 2350 09/23/13 0530  TROPONINI <0.30 <0.30   BNP: No components found with this basename: POCBNP,  CBG: No results found for this basename: GLUCAP,  in the last 168 hours  Significant Diagnostic Studies:  Dg Hip Complete Right  2013/10/14   CLINICAL DATA:  Weakness and pain right hip  EXAM: RIGHT HIP - COMPLETE 2+ VIEW  COMPARISON:  None.  FINDINGS: The pelvic bones are intact. There is no evidence of fracture or dislocation involving proximal right femur. There is mild right hip joint narrowing and osteophyte formation. There are no femoral head contour deformities noted on the right.  IMPRESSION: Mild arthritis   Electronically Signed   By: Esperanza Heir M.D.   On: 10/14/13 16:48   Ct Head Wo  Contrast  2013/10/14   CLINICAL DATA:  Possible seizure.  EXAM: CT HEAD WITHOUT CONTRAST  TECHNIQUE: Contiguous axial images were obtained from the base of the skull through the vertex without intravenous contrast.  COMPARISON:  Head CT scan 11/18/2009.  FINDINGS: There is chronic microvascular ischemic change and marked atrophy. No evidence of acute abnormality including infarction, hemorrhage, mass lesion, mass effect, midline shift or abnormal extra-axial fluid collection is identified. There is no hydrocephalus or pneumocephalus. The calvarium is intact.  IMPRESSION: No acute finding.   Electronically Signed   By: Drusilla Kanner M.D.   On: 10-14-13 12:51    2D ECHO: Study Conclusions  - Left ventricle: The cavity size was normal. Wall thickness was normal. Systolic function was normal. The estimated ejection fraction was in the range of 55% to 60%. Wall motion was normal; there were no regional wall motion abnormalities. Doppler parameters are  consistent with abnormal left ventricular relaxation (grade 1 diastolic dysfunction). The E/e' ratio is ~10, suggesting borderline elevated LV filling pressure. - Aortic valve: Trileaflet. Sclerosis without stenosis. Mild regurgitation. - Mitral valve: Mildly thickened leaflets . Mild regurgitation. - Left atrium: The atrium was normal in size. - Right atrium: The atrium was mildly dilated. - Tricuspid valve: Mild regurgitation. - Pulmonary arteries: PA peak pressure: 33mm Hg (S). - Inferior vena cava: The vessel was normal in size; the respirophasic diameter changes were in the normal range (= 50%); findings are consistent with normal central venous pressure. - Pericardium, extracardiac: There was no pericardial effusion.    Disposition and Follow-up:     Discharge Orders   Future Appointments Provider Department Dept Phone   09/23/2013 4:00 PM [facility] EEG [phone]   09/26/2013 8:15 AM Kermit Balo, DO [facility] [phone]   Future Orders Complete By Expires   Diet general  As directed    Increase activity slowly  As directed        DISPOSITION: Home DIET: Heart healthy ACTIVITY: As tolerated   DISCHARGE FOLLOW-UP Follow-up Information   Follow up with REED, TIFFANY, DO. Schedule an appointment as soon as possible for a visit in 2 weeks. [facility] followup)    Specialty:  Geriatric Medicine   Contact information:   [address] [place] [address] [phone]       Schedule an appointment as soon as possible for a visit with [facility]. (As needed if symptoms worsen)    Contact information:   [address] [place] [address] [phone]      Time spent on Discharge: 35 minutes  Signed:   RAI,RIPUDEEP M.D. [facility] 09/23/2013, 3:24 PM Pager: [phone]

## 2013-09-24 NOTE — ED Provider Notes (Signed)
Medical screening examination/treatment/procedure(s) were conducted as a shared visit with non-physician practitioner(s) and myself.  I personally evaluated the patient during the encounter.   Date: 09/23/2013  Rate: 75  Rhythm: normal sinus rhythm  QRS Axis: normal  Intervals: QT prolonged  ST/T Wave abnormalities: nonspecific ST changes  Conduction Disutrbances:none  Narrative Interpretation:   Old EKG Reviewed: none available  Admit for syncope.  QTc prolonged.  VSS in ER.       Darlys Gales, MD 09/24/13 [phone]

## 2013-09-26 ENCOUNTER — Ambulatory Visit: Payer: Medicare Other | Admitting: Internal Medicine

## 2013-10-14 ENCOUNTER — Encounter: Payer: Self-pay | Admitting: Internal Medicine

## 2013-10-14 ENCOUNTER — Ambulatory Visit (INDEPENDENT_AMBULATORY_CARE_PROVIDER_SITE_OTHER): Payer: Medicare Other | Admitting: Internal Medicine

## 2013-10-14 VITALS — BP 104/70 | HR 47 | Temp 97.6°F | Wt 88.0 lb

## 2013-10-14 DIAGNOSIS — F341 Dysthymic disorder: Secondary | ICD-10-CM

## 2013-10-14 DIAGNOSIS — F329 Major depressive disorder, single episode, unspecified: Secondary | ICD-10-CM

## 2013-10-14 DIAGNOSIS — R001 Bradycardia, unspecified: Secondary | ICD-10-CM | POA: Insufficient documentation

## 2013-10-14 DIAGNOSIS — F0280 Dementia in other diseases classified elsewhere without behavioral disturbance: Secondary | ICD-10-CM

## 2013-10-14 DIAGNOSIS — I498 Other specified cardiac arrhythmias: Secondary | ICD-10-CM

## 2013-10-14 DIAGNOSIS — J209 Acute bronchitis, unspecified: Secondary | ICD-10-CM

## 2013-10-14 MED ORDER — GUAIFENESIN-DM 100-10 MG/5ML PO SYRP: ORAL_SOLUTION | ORAL | Status: DC

## 2013-10-14 MED ORDER — ALPRAZOLAM 0.25 MG PO TABS: 0.2500 mg | ORAL_TABLET | Freq: Two times a day (BID) | ORAL | Status: DC | PRN

## 2013-10-14 NOTE — Patient Instructions (Addendum)
Try Robitussin DM for cough suppressant.  Try nutritional supplements.

## 2013-10-14 NOTE — Progress Notes (Signed)
Patient ID: Theresa Rodriguez, female   DOB: [birth year], 79 y.o.   MRN: [id number]    Location:    [facility]  Place of Service:  OFFICE    Allergies  Allergen Reactions  . Tessalon [Benzonatate]     Hallucinations     Chief Complaint  Patient presents with  . Dizziness  . Anorexia    Not eating or drinking well   . Weakness    Overall feels horrible   . Cough    Ongoing concern   . Fatigue    Failure to thrive   . Excessive Burping  . Anxiety    Increased Anxiety     HPI:  Went to [facility] (10/07/13 and 10/12/13). Seen for cough. Placed on Azithromycin, Tessalon Perles (did not use because of a history of a bad reaction of hallucinations), prednisone. Was told her lungs are clear. Continues with cough that interrupts sleep. CXR was reported normal. Honey helps a little. Has some trouble with swallowing large pills.  Has been losing weight.  Medications: Patient's Medications  New Prescriptions   No medications on file  Previous Medications   ACETAMINOPHEN (TYLENOL) 500 MG TABLET    Take one to two tablets by mouth up to three times daily for pain   CHOLECALCIFEROL (VITAMIN D3 ADULT GUMMIES PO)    Take by mouth. 2 by mouth daily   DICLOFENAC SODIUM (VOLTAREN) 1 % GEL    Apply 4 grams four times daily to knees as needed for pain   DIETARY MANAGEMENT PRODUCT (AXONA PO)    3 tablespoons once daily to help preserve memory   DONEPEZIL (ARICEPT) 5 MG TABLET    Take one tablet in the morning and one tablet in the evening.  Modified Medications   No medications on file  Discontinued Medications   CIPROFLOXACIN (CIPRO) 250 MG TABLET    Take 1 tablet (250 mg total) by mouth 2 (two) times daily. X 3 days     Review of Systems  Constitutional:       Losing weight. Has to be reminded to eat and drink. Elderly. Frail.  HENT: Positive for congestion and hearing loss.   Eyes: Negative.   Respiratory: Positive for cough and shortness of breath.   Cardiovascular:  Negative for chest pain, palpitations and leg swelling.  Gastrointestinal: Negative for abdominal pain, diarrhea and abdominal distention.  Endocrine: Negative.   Genitourinary: Negative for dysuria, frequency, decreased urine volume and pelvic pain.  Musculoskeletal: Positive for arthralgias. Negative for gait problem, myalgias and neck pain.  Skin: Negative.   Allergic/Immunologic: Negative.   Neurological:       Dementia  Hematological: Negative.   Psychiatric/Behavioral: Positive for sleep disturbance and dysphoric mood.    Filed Vitals:   10/14/13 1539  BP: 104/70  Pulse: 47  Temp: 97.6 F (36.4 C)  TempSrc: Axillary  Weight: 88 lb (39.917 kg)  SpO2: 95%   Physical Exam  Constitutional: She is oriented to person, place, and time.  Frail, elderly, acutely and chronically ill.  HENT:  Right Ear: External ear normal.  Left Ear: External ear normal.  Loss of hearing  Eyes: Conjunctivae and EOM are normal. Pupils are equal, round, and reactive to light.  Neck: No JVD present. No tracheal deviation present. No thyromegaly present.  Cardiovascular: Normal rate and intact distal pulses.  Exam reveals no gallop and no friction rub.   No murmur heard. Irregular rhythm with premature beats.  Pulmonary/Chest: No respiratory distress. She  has no wheezes. She has no rales. She exhibits no tenderness.  Abdominal: She exhibits no distension and no mass. There is no tenderness.  Musculoskeletal: Normal range of motion. She exhibits no edema and no tenderness.  Lymphadenopathy:    She has no cervical adenopathy.  Neurological: She is alert and oriented to person, place, and time. She has normal reflexes.  Demented.  Skin: No rash noted. No erythema. No pallor.  Psychiatric: She has a normal mood and affect.     Labs reviewed: Admission on 09/22/2013, Discharged on 09/23/2013  Component Date Value Range Status  . WBC 09/22/2013 9.2  4.0 - 10.5 K/uL Final  . RBC 09/22/2013 4.11   3.87 - 5.11 MIL/uL Final  . Hemoglobin 09/22/2013 11.8* 12.0 - 15.0 g/dL Final  . HCT 09/81/1914 36.7  36.0 - 46.0 % Final  . MCV 09/22/2013 89.3  78.0 - 100.0 fL Final  . MCH 09/22/2013 28.7  26.0 - 34.0 pg Final  . MCHC 09/22/2013 32.2  30.0 - 36.0 g/dL Final  . RDW 78/29/5621 15.7* 11.5 - 15.5 % Final  . Platelets 09/22/2013 232  150 - 400 K/uL Final  . Neutrophils Relative % 09/22/2013 82* 43 - 77 % Final  . Neutro Abs 09/22/2013 7.6  1.7 - 7.7 K/uL Final  . Lymphocytes Relative 09/22/2013 11* 12 - 46 % Final  . Lymphs Abs 09/22/2013 1.0  0.7 - 4.0 K/uL Final  . Monocytes Relative 09/22/2013 7  3 - 12 % Final  . Monocytes Absolute 09/22/2013 0.6  0.1 - 1.0 K/uL Final  . Eosinophils Relative 09/22/2013 0  0 - 5 % Final  . Eosinophils Absolute 09/22/2013 0.0  0.0 - 0.7 K/uL Final  . Basophils Relative 09/22/2013 0  0 - 1 % Final  . Basophils Absolute 09/22/2013 0.0  0.0 - 0.1 K/uL Final  . Sodium 09/22/2013 137  135 - 145 mEq/L Final  . Potassium 09/22/2013 4.0  3.5 - 5.1 mEq/L Final  . Chloride 09/22/2013 100  96 - 112 mEq/L Final  . CO2 09/22/2013 28  19 - 32 mEq/L Final  . Glucose, Bld 09/22/2013 104* 70 - 99 mg/dL Final  . BUN 30/86/5784 30* 6 - 23 mg/dL Final  . Creatinine, Ser 09/22/2013 0.86  0.50 - 1.10 mg/dL Final  . Calcium 69/62/9528 9.3  8.4 - 10.5 mg/dL Final  . GFR calc non Af Amer 09/22/2013 58* >90 mL/min Final  . GFR calc Af Amer 09/22/2013 67* >90 mL/min Final   Comment: (NOTE)                          The eGFR has been calculated using the CKD EPI equation.                          This calculation has not been validated in all clinical situations.                          eGFR's persistently <90 mL/min signify possible Chronic Kidney                          Disease.  . Color, Urine 09/22/2013 YELLOW  YELLOW Final  . APPearance 09/22/2013 CLOUDY* CLEAR Final  . Specific Gravity, Urine 09/22/2013 1.017  1.005 - 1.030 Final  . pH 09/22/2013 5.5  5.0 - 8.0 Final   .  Glucose, UA 09/22/2013 NEGATIVE  NEGATIVE mg/dL Final  . Hgb urine dipstick 09/22/2013 MODERATE* NEGATIVE Final  . Bilirubin Urine 09/22/2013 NEGATIVE  NEGATIVE Final  . Ketones, ur 09/22/2013 NEGATIVE  NEGATIVE mg/dL Final  . Protein, ur 45/40/9811 NEGATIVE  NEGATIVE mg/dL Final  . Urobilinogen, UA 09/22/2013 0.2  0.0 - 1.0 mg/dL Final  . Nitrite 91/47/8295 NEGATIVE  NEGATIVE Final  . Leukocytes, UA 09/22/2013 MODERATE* NEGATIVE Final  . Specimen Description 09/22/2013 URINE, RANDOM   Final  . Special Requests 09/22/2013 Normal   Final  . Culture  Setup Time 09/22/2013    Final                   Value:09/22/2013 16:50                         Performed at [facility]  . Colony Count 09/22/2013    Final                   Value:80,000 COLONIES/ML                         Performed at [facility]  . Culture 09/22/2013    Final                   Value:Multiple bacterial morphotypes present, none predominant. Suggest appropriate recollection if clinically indicated.                         Performed at [facility]  . Report Status 09/22/2013 09/23/2013 FINAL   Final  . Troponin i, poc 09/22/2013 0.01  0.00 - 0.08 ng/mL Final  . Comment 3 09/22/2013          Final   Comment: Due to the release kinetics of cTnI,                          a negative result within the first hours                          of the onset of symptoms does not rule out                          myocardial infarction with certainty.                          If myocardial infarction is still suspected,                          repeat the test at appropriate intervals.  . Squamous Epithelial / LPF 09/22/2013 MANY* RARE Final  . WBC, UA 09/22/2013 3-6  <3 WBC/hpf Final  . RBC / HPF 09/22/2013 3-6  <3 RBC/hpf Final  . Bacteria, UA 09/22/2013 MANY* RARE Final  . Free T4 09/22/2013 0.94  0.80 - 1.80 ng/dL Final   Performed at [facility]  . Troponin I 09/22/2013 <0.30  <0.30 ng/mL  Final   Comment:                                 Due to the release kinetics of cTnI,  a negative result within the first hours                          of the onset of symptoms does not rule out                          myocardial infarction with certainty.                          If myocardial infarction is still suspected,                          repeat the test at appropriate intervals.  . Troponin I 09/22/2013 <0.30  <0.30 ng/mL Final   Comment:                                 Due to the release kinetics of cTnI,                          a negative result within the first hours                          of the onset of symptoms does not rule out                          myocardial infarction with certainty.                          If myocardial infarction is still suspected,                          repeat the test at appropriate intervals.  . Troponin I 09/23/2013 <0.30  <0.30 ng/mL Final   Comment:                                 Due to the release kinetics of cTnI,                          a negative result within the first hours                          of the onset of symptoms does not rule out                          myocardial infarction with certainty.                          If myocardial infarction is still suspected,                          repeat the test at appropriate intervals.  Marland Kitchen TSH 09/22/2013 3.070  0.350 - 4.500 uIU/mL Final   Performed at [facility]  . Sodium 09/23/2013 139  135 - 145 mEq/L Final  . Potassium 09/23/2013 3.7  3.5 - 5.1 mEq/L Final  . Chloride 09/23/2013 105  96 - 112 mEq/L  Final  . CO2 09/23/2013 26  19 - 32 mEq/L Final  . Glucose, Bld 09/23/2013 89  70 - 99 mg/dL Final  . BUN 16/07/9603 30* 6 - 23 mg/dL Final  . Creatinine, Ser 09/23/2013 0.92  0.50 - 1.10 mg/dL Final  . Calcium 54/06/8118 8.5  8.4 - 10.5 mg/dL Final  . Total Protein 09/23/2013 6.4  6.0 - 8.3 g/dL Final  . Albumin 14/78/2956 2.8* 3.5 -  5.2 g/dL Final  . AST 21/30/8657 15  0 - 37 U/L Final  . ALT 09/23/2013 11  0 - 35 U/L Final  . Alkaline Phosphatase 09/23/2013 61  39 - 117 U/L Final  . Total Bilirubin 09/23/2013 0.5  0.3 - 1.2 mg/dL Final  . GFR calc non Af Amer 09/23/2013 54* >90 mL/min Final  . GFR calc Af Amer 09/23/2013 62* >90 mL/min Final   Comment: (NOTE)                          The eGFR has been calculated using the CKD EPI equation.                          This calculation has not been validated in all clinical situations.                          eGFR's persistently <90 mL/min signify possible Chronic Kidney                          Disease.  . WBC 09/23/2013 6.5  4.0 - 10.5 K/uL Final  . RBC 09/23/2013 3.64* 3.87 - 5.11 MIL/uL Final  . Hemoglobin 09/23/2013 10.2* 12.0 - 15.0 g/dL Final  . HCT 84/69/6295 32.4* 36.0 - 46.0 % Final  . MCV 09/23/2013 89.0  78.0 - 100.0 fL Final  . MCH 09/23/2013 28.0  26.0 - 34.0 pg Final  . MCHC 09/23/2013 31.5  30.0 - 36.0 g/dL Final  . RDW 28/41/3244 15.6* 11.5 - 15.5 % Final  . Platelets 09/23/2013 220  150 - 400 K/uL Final  . Vitamin B-12 09/22/2013 868  211 - 911 pg/mL Final   Performed at [facility]   10/14/2013 EKG;  Rate 85. PAC. otherwise normal.   Assessment/Plan  1. Acute bronchitis Unchanged. Continue to monitor  2. Alzheimer's disease unchnaged  3. Anxiety and depression Consider use of alprazolam  4. Bradycardia EKG did not confirm bradycardia. It did show PAC. Some beats were likely too weak to be felt and led to erroneous count on checking her pulse. - EKG 12-Lead

## 2013-10-19 ENCOUNTER — Emergency Department (HOSPITAL_COMMUNITY): Payer: Medicare Other

## 2013-10-19 ENCOUNTER — Encounter (HOSPITAL_COMMUNITY): Payer: Self-pay | Admitting: Emergency Medicine

## 2013-10-19 ENCOUNTER — Inpatient Hospital Stay (HOSPITAL_COMMUNITY)
Admission: EM | Admit: 2013-10-19 | Discharge: 2013-10-22 | DRG: 194 | Disposition: A | Discharge status: Home Health | Payer: Medicare Other | Attending: Internal Medicine | Admitting: Internal Medicine

## 2013-10-19 DIAGNOSIS — M1710 Unilateral primary osteoarthritis, unspecified knee: Secondary | ICD-10-CM | POA: Diagnosis present

## 2013-10-19 DIAGNOSIS — F329 Major depressive disorder, single episode, unspecified: Secondary | ICD-10-CM

## 2013-10-19 DIAGNOSIS — Z8249 Family history of ischemic heart disease and other diseases of the circulatory system: Secondary | ICD-10-CM

## 2013-10-19 DIAGNOSIS — Z66 Do not resuscitate: Secondary | ICD-10-CM | POA: Diagnosis present

## 2013-10-19 DIAGNOSIS — R55 Syncope and collapse: Secondary | ICD-10-CM

## 2013-10-19 DIAGNOSIS — M169 Osteoarthritis of hip, unspecified: Secondary | ICD-10-CM

## 2013-10-19 DIAGNOSIS — I10 Essential (primary) hypertension: Secondary | ICD-10-CM | POA: Diagnosis present

## 2013-10-19 DIAGNOSIS — Z950 Presence of cardiac pacemaker: Secondary | ICD-10-CM

## 2013-10-19 DIAGNOSIS — J189 Pneumonia, unspecified organism: Principal | ICD-10-CM | POA: Diagnosis present

## 2013-10-19 DIAGNOSIS — R1311 Dysphagia, oral phase: Secondary | ICD-10-CM | POA: Diagnosis present

## 2013-10-19 DIAGNOSIS — E039 Hypothyroidism, unspecified: Secondary | ICD-10-CM | POA: Diagnosis present

## 2013-10-19 DIAGNOSIS — F341 Dysthymic disorder: Secondary | ICD-10-CM

## 2013-10-19 DIAGNOSIS — M810 Age-related osteoporosis without current pathological fracture: Secondary | ICD-10-CM | POA: Diagnosis present

## 2013-10-19 DIAGNOSIS — R001 Bradycardia, unspecified: Secondary | ICD-10-CM

## 2013-10-19 DIAGNOSIS — E785 Hyperlipidemia, unspecified: Secondary | ICD-10-CM | POA: Diagnosis present

## 2013-10-19 DIAGNOSIS — F411 Generalized anxiety disorder: Secondary | ICD-10-CM | POA: Diagnosis present

## 2013-10-19 DIAGNOSIS — Z681 Body mass index (BMI) 19 or less, adult: Secondary | ICD-10-CM

## 2013-10-19 DIAGNOSIS — G309 Alzheimer's disease, unspecified: Secondary | ICD-10-CM | POA: Diagnosis present

## 2013-10-19 DIAGNOSIS — Z87891 Personal history of nicotine dependence: Secondary | ICD-10-CM

## 2013-10-19 DIAGNOSIS — I495 Sick sinus syndrome: Secondary | ICD-10-CM | POA: Diagnosis present

## 2013-10-19 DIAGNOSIS — N390 Urinary tract infection, site not specified: Secondary | ICD-10-CM | POA: Diagnosis present

## 2013-10-19 DIAGNOSIS — G4700 Insomnia, unspecified: Secondary | ICD-10-CM | POA: Diagnosis present

## 2013-10-19 DIAGNOSIS — F0280 Dementia in other diseases classified elsewhere without behavioral disturbance: Secondary | ICD-10-CM | POA: Diagnosis present

## 2013-10-19 DIAGNOSIS — E875 Hyperkalemia: Secondary | ICD-10-CM

## 2013-10-19 DIAGNOSIS — R1310 Dysphagia, unspecified: Secondary | ICD-10-CM

## 2013-10-19 DIAGNOSIS — J209 Acute bronchitis, unspecified: Secondary | ICD-10-CM

## 2013-10-19 DIAGNOSIS — Z79899 Other long term (current) drug therapy: Secondary | ICD-10-CM

## 2013-10-19 DIAGNOSIS — F3289 Other specified depressive episodes: Secondary | ICD-10-CM | POA: Diagnosis present

## 2013-10-19 DIAGNOSIS — E559 Vitamin D deficiency, unspecified: Secondary | ICD-10-CM | POA: Diagnosis present

## 2013-10-19 DIAGNOSIS — Z853 Personal history of malignant neoplasm of breast: Secondary | ICD-10-CM

## 2013-10-19 DIAGNOSIS — F419 Anxiety disorder, unspecified: Secondary | ICD-10-CM

## 2013-10-19 DIAGNOSIS — E46 Unspecified protein-calorie malnutrition: Secondary | ICD-10-CM | POA: Diagnosis present

## 2013-10-19 LAB — CBC WITH DIFFERENTIAL/PLATELET
Basophils Absolute: 0 10*3/uL (ref 0.0–0.1)
Basophils Relative: 0 % (ref 0–1)
EOS ABS: 0 10*3/uL (ref 0.0–0.7)
Eosinophils Relative: 0 % (ref 0–5)
HCT: 29.5 % — ABNORMAL LOW (ref 36.0–46.0)
Hemoglobin: 9.7 g/dL — ABNORMAL LOW (ref 12.0–15.0)
LYMPHS ABS: 1.3 10*3/uL (ref 0.7–4.0)
Lymphocytes Relative: 8 % — ABNORMAL LOW (ref 12–46)
MCH: 28.2 pg (ref 26.0–34.0)
MCHC: 32.9 g/dL (ref 30.0–36.0)
MCV: 85.8 fL (ref 78.0–100.0)
Monocytes Absolute: 0.6 10*3/uL (ref 0.1–1.0)
Monocytes Relative: 4 % (ref 3–12)
NEUTROS ABS: 13.6 10*3/uL — AB (ref 1.7–7.7)
Neutrophils Relative %: 88 % — ABNORMAL HIGH (ref 43–77)
PLATELETS: 478 10*3/uL — AB (ref 150–400)
RBC: 3.44 MIL/uL — AB (ref 3.87–5.11)
RDW: 14.3 % (ref 11.5–15.5)
WBC: 15.5 10*3/uL — ABNORMAL HIGH (ref 4.0–10.5)

## 2013-10-19 LAB — URINE MICROSCOPIC-ADD ON

## 2013-10-19 LAB — URINALYSIS, ROUTINE W REFLEX MICROSCOPIC
Bilirubin Urine: NEGATIVE
Glucose, UA: NEGATIVE mg/dL
HGB URINE DIPSTICK: NEGATIVE
Ketones, ur: NEGATIVE mg/dL
Nitrite: NEGATIVE
PH: 5 (ref 5.0–8.0)
Protein, ur: 30 mg/dL — AB
SPECIFIC GRAVITY, URINE: 1.016 (ref 1.005–1.030)
UROBILINOGEN UA: 0.2 mg/dL (ref 0.0–1.0)

## 2013-10-19 LAB — BASIC METABOLIC PANEL
BUN: 41 mg/dL — ABNORMAL HIGH (ref 6–23)
CO2: 28 mEq/L (ref 19–32)
Calcium: 9 mg/dL (ref 8.4–10.5)
Chloride: 92 mEq/L — ABNORMAL LOW (ref 96–112)
Creatinine, Ser: 1.42 mg/dL — ABNORMAL HIGH (ref 0.50–1.10)
GFR calc Af Amer: 37 mL/min — ABNORMAL LOW (ref >90)
GFR, EST NON AFRICAN AMERICAN: 32 mL/min — AB (ref >90)
Glucose, Bld: 90 mg/dL (ref 70–99)
POTASSIUM: 3.9 meq/L (ref 3.7–5.3)
SODIUM: 133 meq/L — AB (ref 137–147)

## 2013-10-19 MED ORDER — LORAZEPAM 1 MG PO TABS
0.5000 mg | ORAL_TABLET | Freq: Once | ORAL | Status: AC
  Administered 2013-10-19: 0.5 mg via ORAL
  Filled 2013-10-19: qty 1

## 2013-10-19 MED ORDER — ALBUTEROL SULFATE (2.5 MG/3ML) 0.083% IN NEBU: 2.5000 mg | INHALATION_SOLUTION | Freq: Four times a day (QID) | RESPIRATORY_TRACT | Status: DC

## 2013-10-19 MED ORDER — ALPRAZOLAM 0.25 MG PO TABS
0.2500 mg | ORAL_TABLET | Freq: Two times a day (BID) | ORAL | Status: DC | PRN
  Administered 2013-10-19 – 2013-10-22 (×4): 0.25 mg via ORAL
  Filled 2013-10-19 (×4): qty 1

## 2013-10-19 MED ORDER — SODIUM CHLORIDE 0.9 % IV SOLN: INTRAVENOUS | Status: DC

## 2013-10-19 MED ORDER — GUAIFENESIN-DM 100-10 MG/5ML PO SYRP
5.0000 mL | ORAL_SOLUTION | ORAL | Status: DC | PRN
  Administered 2013-10-19 – 2013-10-22 (×8): 5 mL via ORAL
  Filled 2013-10-19 (×8): qty 5

## 2013-10-19 MED ORDER — SODIUM CHLORIDE 0.45 % IV SOLN
INTRAVENOUS | Status: DC
  Administered 2013-10-19 – 2013-10-21 (×3): via INTRAVENOUS

## 2013-10-19 MED ORDER — DEXTROSE 5 % IV SOLN
500.0000 mg | INTRAVENOUS | Status: DC
  Administered 2013-10-20 – 2013-10-21 (×2): 500 mg via INTRAVENOUS
  Filled 2013-10-19 (×3): qty 0.5

## 2013-10-19 MED ORDER — ACETAMINOPHEN 650 MG RE SUPP: 650.0000 mg | Freq: Four times a day (QID) | RECTAL | Status: DC | PRN

## 2013-10-19 MED ORDER — ENOXAPARIN SODIUM 30 MG/0.3ML ~~LOC~~ SOLN
20.0000 mg | SUBCUTANEOUS | Status: DC
  Administered 2013-10-19: 20 mg via SUBCUTANEOUS
  Filled 2013-10-19: qty 0.3
  Filled 2013-10-19 (×2): qty 0.2
  Filled 2013-10-19 (×2): qty 0.3
  Filled 2013-10-19 (×2): qty 0.2

## 2013-10-19 MED ORDER — ALBUTEROL SULFATE (2.5 MG/3ML) 0.083% IN NEBU
2.5000 mg | INHALATION_SOLUTION | RESPIRATORY_TRACT | Status: DC | PRN
Start: 2013-10-19 — End: 2013-10-22

## 2013-10-19 MED ORDER — MORPHINE SULFATE 2 MG/ML IJ SOLN: 1.0000 mg | INTRAMUSCULAR | Status: DC | PRN

## 2013-10-19 MED ORDER — ACETAMINOPHEN 325 MG PO TABS
650.0000 mg | ORAL_TABLET | Freq: Four times a day (QID) | ORAL | Status: DC | PRN
  Administered 2013-10-21: 650 mg via ORAL
  Filled 2013-10-19: qty 2

## 2013-10-19 MED ORDER — ENOXAPARIN SODIUM 30 MG/0.3ML ~~LOC~~ SOLN: 30.0000 mg | SUBCUTANEOUS | Status: DC

## 2013-10-19 MED ORDER — DONEPEZIL HCL 5 MG PO TABS
5.0000 mg | ORAL_TABLET | Freq: Two times a day (BID) | ORAL | Status: DC
  Administered 2013-10-19 – 2013-10-22 (×6): 5 mg via ORAL
  Filled 2013-10-19 (×9): qty 1

## 2013-10-19 MED ORDER — SODIUM CHLORIDE 0.9 % IV BOLUS (SEPSIS)
500.0000 mL | Freq: Once | INTRAVENOUS | Status: AC
  Administered 2013-10-19: 500 mL via INTRAVENOUS

## 2013-10-19 MED ORDER — VANCOMYCIN HCL 500 MG IV SOLR
500.0000 mg | INTRAVENOUS | Status: DC
  Administered 2013-10-21: 500 mg via INTRAVENOUS
  Filled 2013-10-19: qty 500

## 2013-10-19 MED ORDER — GUAIFENESIN ER 600 MG PO TB12
600.0000 mg | ORAL_TABLET | Freq: Two times a day (BID) | ORAL | Status: DC
  Administered 2013-10-19 – 2013-10-22 (×6): 600 mg via ORAL
  Filled 2013-10-19 (×9): qty 1

## 2013-10-19 MED ORDER — IPRATROPIUM BROMIDE 0.02 % IN SOLN: 0.5000 mg | Freq: Four times a day (QID) | RESPIRATORY_TRACT | Status: DC

## 2013-10-19 MED ORDER — CEFEPIME HCL 1 G IJ SOLR
1.0000 g | Freq: Once | INTRAMUSCULAR | Status: AC
  Administered 2013-10-19: 1 g via INTRAVENOUS
  Filled 2013-10-19: qty 1

## 2013-10-19 MED ORDER — VANCOMYCIN HCL IN DEXTROSE 750-5 MG/150ML-% IV SOLN
750.0000 mg | INTRAVENOUS | Status: AC
  Administered 2013-10-19: 750 mg via INTRAVENOUS
  Filled 2013-10-19: qty 150

## 2013-10-19 MED ORDER — HYDROCODONE-ACETAMINOPHEN 5-325 MG PO TABS: 1.0000 | ORAL_TABLET | ORAL | Status: DC | PRN

## 2013-10-19 NOTE — ED Provider Notes (Addendum)
CSN: [id number]     Arrival date & time 10/19/13  1201 History   First MD Initiated Contact with Patient 10/19/13 1217     Chief Complaint  Patient presents with  . Bronchitis   (Consider location/radiation/quality/duration/timing/severity/associated sxs/prior Treatment) HPI Comments: Patient presents with coughing and shortness of breath. Per daughter, she has been sick for about last week and half. She initially had a urinary tract infection was treated with Cipro. Over the last week she seen her doctor 4 times. She initially was started on a Z-Pak for possible bronchitis. She then saw Dr. Wilburn Mylar and was told that a urinary tract infection come back and she also has a bronchitis and was started on Levaquin but she's only taken one dose yesterday. She refused her dose today. Her daughter said she's had decreased by mouth intake and increased fatigue. She's noticed that she's been short of breath at times particularly during coughing spells but also just in sitting in her chair. She hasn't had any noted fevers. No vomiting or diarrhea. She does have dementia with ongoing confusion but has been slightly more confused during this illness.   Past Medical History  Diagnosis Date  . Vitamin D deficiency   . Insomnia, unspecified   . Other specified cardiac dysrhythmias(427.89)   . Nausea with vomiting   . Dysphagia, oral phase   . Loss of weight   . Hyperpotassemia   . Dizziness and giddiness   . Chronic lymphocytic thyroiditis   . Other and unspecified hyperlipidemia   . Hypothyroidism   . Unspecified protein-calorie malnutrition   . Osteoarthritis, knee   . Senile osteoporosis   . Anxiety and depression   . Breast cancer     "right" (09/22/2013)  . Benign hypertension     "took off BP meds this fall" (09/22/2013)  . Alzheimer's disease     "probably moderate" (09/22/2013)  . Anxiety   . Seizures     "might have had one today; not sure" (09/22/2013   Past Surgical History  Procedure  Laterality Date  . Eye surgery    . Knee surgery Right 1940's    "cyst on her knee" (09/22/2013)  . Breast lumpectomy Right 1995   Family History  Problem Relation Age of Onset  . Pneumonia Mother   . Heart attack Father   . Pneumonia Brother    History  Substance Use Topics  . Smoking status: Former Smoker -- 1.00 packs/day for 20 years    Types: Cigarettes  . Smokeless tobacco: Never Used     Comment: 09/22/2013 "quite smoking ~ 1960's"  . Alcohol Use: 4.2 oz/week    7 Cans of beer per week     Comment: 09/22/2013 "beer q hs"   OB History   Grav Para Term Preterm Abortions TAB SAB Ect Mult Living                 Review of Systems  Unable to perform ROS: Dementia    Allergies  Tessalon  Home Medications   Current Outpatient Rx  Name  Route  Sig  Dispense  Refill  . acetaminophen (TYLENOL) 500 MG tablet   Oral   Take 500-1,000 mg by mouth every 8 (eight) hours as needed for mild pain.          Marland Kitchen ALPRAZolam (XANAX) 0.25 MG tablet   Oral   Take 1 tablet (0.25 mg total) by mouth 2 (two) times daily as needed for anxiety.   20 tablet  0   . Cholecalciferol (VITAMIN D3 ADULT GUMMIES PO)   Oral   Take 2 tablets by mouth daily.          . diclofenac sodium (VOLTAREN) 1 % GEL      4 g 4 (four) times daily.          Marland Kitchen donepezil (ARICEPT) 5 MG tablet   Oral   Take 5 mg by mouth 2 (two) times daily.         Marland Kitchen levofloxacin (LEVAQUIN) 500 MG tablet   Oral   Take 500 mg by mouth daily. Take for 7 days. First dose on 10/18/12          BP 122/68  Pulse 85  Temp(Src) 97.9 F (36.6 C) (Oral)  Resp 16  Ht 5\' 2"  (1.575 m)  Wt 90 lb (40.824 kg)  BMI 16.46 kg/m2  SpO2 97% Physical Exam  Constitutional: She appears well-developed and well-nourished.  HENT:  Head: Normocephalic and atraumatic.  Mouth/Throat: Oropharynx is clear and moist.  Eyes: Pupils are equal, round, and reactive to light.  Neck: Normal range of motion. Neck supple.  Cardiovascular:  Normal rate, regular rhythm and normal heart sounds.   Pulmonary/Chest: Effort normal and breath sounds normal. No respiratory distress. She has no wheezes. She has no rales. She exhibits no tenderness.  Abdominal: Soft. Bowel sounds are normal. There is no tenderness. There is no rebound and no guarding.  Musculoskeletal: Normal range of motion. She exhibits no edema.  Lymphadenopathy:    She has no cervical adenopathy.  Neurological: She is alert.  Sitting up in bed, alert but confused moves all extremities symmetrically.  Skin: Skin is warm and dry. No rash noted.  Psychiatric: She has a normal mood and affect.    ED Course  Procedures (including critical care time) Labs Review Labs Reviewed  CBC WITH DIFFERENTIAL - Abnormal; Notable for the following:    WBC 15.5 (*)    RBC 3.44 (*)    Hemoglobin 9.7 (*)    HCT 29.5 (*)    Platelets 478 (*)    Neutrophils Relative % 88 (*)    Neutro Abs 13.6 (*)    Lymphocytes Relative 8 (*)    All other components within normal limits  BASIC METABOLIC PANEL - Abnormal; Notable for the following:    Sodium 133 (*)    Chloride 92 (*)    BUN 41 (*)    Creatinine, Ser 1.42 (*)    GFR calc non Af Amer 32 (*)    GFR calc Af Amer 37 (*)    All other components within normal limits  URINALYSIS, ROUTINE W REFLEX MICROSCOPIC - Abnormal; Notable for the following:    APPearance TURBID (*)    Protein, ur 30 (*)    Leukocytes, UA SMALL (*)    All other components within normal limits  URINE MICROSCOPIC-ADD ON - Abnormal; Notable for the following:    Casts GRANULAR CAST (*)    All other components within normal limits   Imaging Review Dg Chest 2 View  10/19/2013   CLINICAL DATA:  Cough and congestion, altered mental status  EXAM: CHEST  2 VIEW  COMPARISON:  No comparisons  FINDINGS: Normal cardiac silhouette. There is right lower lobe airspace disease. Lungs are hyperinflated. No pneumothorax. Small amount airspace disease also noted in the right  upper lobe superior to the horizontal fissure.  IMPRESSION: Right lower lobe pneumonia with mild involvement of the right upper lobe additionally.  Electronically Signed   By: Suzy Bouchard M.D.   On: 10/19/2013 14:48    EKG Interpretation    Date/Time:  Sunday October 19 2013 12:50:36 EST Ventricular Rate:  89 PR Interval:  130 QRS Duration: 80 QT Interval:  367 QTC Calculation: 446 R Axis:   78 Text Interpretation:  Normal sinus rhythm since last tracing no significant change Confirmed by Mahki Spikes  MD, Hurley Blevins ([id number]) on 10/19/2013 3:10:25 PM            MDM   1. Healthcare-associated pneumonia    Patient presents with worsening cough and decreased appetite. She has evidence of pneumonia on x-ray. This has failed outpatient treatment. I will consult hospitalist for admission. She was admitted on December 8 and I will treat her for her healthcare acquired pneumonia.    Malvin Johns, MD 10/19/13 [place], MD 10/19/13 [place], MD 10/19/13 1530

## 2013-10-19 NOTE — ED Notes (Signed)
Per EMS pt was dx with bronchitis on the 12/23- was finished cipro and Z-pack for UTI and bronchitis. Pt was not getting better pt was taken to PCP and given levoquin PO. Pt had one dose and refused dose today. Pt has not been eating and drinking in the past few days. Pt appears very anxious. EMS sts was increasingly anxious without daughter.

## 2013-10-19 NOTE — Progress Notes (Addendum)
ANTIBIOTIC CONSULT NOTE - INITIAL  Pharmacy Consult for Vancomycin Indication: pneumonia  Allergies  Allergen Reactions  . Tessalon [Benzonatate]     Hallucinations     Patient Measurements: Height: 5\' 2"  (157.5 cm) Weight: 90 lb (40.824 kg) IBW/kg (Calculated) : 50.1 Adjusted Body Weight:    Vital Signs: Temp: 97.9 F (36.6 C) (01/04 1218) Temp src: Oral (01/04 1218) BP: 122/68 mmHg (01/04 1443) Pulse Rate: 87 (01/04 1443) Intake/Output from previous day:   Intake/Output from this shift:    Labs:  Recent Labs  10/19/13 1223  WBC 15.5*  HGB 9.7*  PLT 478*  CREATININE 1.42*   Estimated Creatinine Clearance: 17.3 ml/min (by C-G formula based on Cr of 1.42). No results found for this basename: VANCOTROUGH, Corlis Leak, VANCORANDOM, GENTTROUGH, GENTPEAK, GENTRANDOM, TOBRATROUGH, TOBRAPEAK, TOBRARND, AMIKACINPEAK, AMIKACINTROU, AMIKACIN,  in the last 72 hours   Microbiology: Recent Results (from the past 720 hour(s))  URINE CULTURE     Status: None   Collection Time    09/22/13 12:05 PM      Result Value Range Status   Specimen Description URINE, RANDOM   Final   Special Requests Normal   Final   Culture  Setup Time     Final   Value: 09/22/2013 16:50     Performed at [facility]     Final   Value: 80,000 COLONIES/ML     Performed at [facility]   Culture     Final   Value: Multiple bacterial morphotypes present, none predominant. Suggest appropriate recollection if clinically indicated.     Performed at [facility]   Report Status 09/23/2013 FINAL   Final    Medical History: Past Medical History  Diagnosis Date  . Vitamin D deficiency   . Insomnia, unspecified   . Other specified cardiac dysrhythmias(427.89)   . Nausea with vomiting   . Dysphagia, oral phase   . Loss of weight   . Hyperpotassemia   . Dizziness and giddiness   . Chronic lymphocytic thyroiditis   . Other and unspecified hyperlipidemia   .  Hypothyroidism   . Unspecified protein-calorie malnutrition   . Osteoarthritis, knee   . Senile osteoporosis   . Anxiety and depression   . Breast cancer     "right" (09/22/2013)  . Benign hypertension     "took off BP meds this fall" (09/22/2013)  . Alzheimer's disease     "probably moderate" (09/22/2013)  . Anxiety   . Seizures     "might have had one today; not sure" (09/22/2013    Medications: see med rec pending  Assessment: Coughing and SOB  80 y/o female weighing only 40.8 kg presents after no getting better on Cipro and ZPack for UTI and bronchitis. Has refused second dose of Levaquin today. Patient has dementia with increasing confusion. Start abx for HCAP.  Labs: WBC 15.5, Hgb 9.7, Scr 1.42 with calculated CrCl 17.  Goal of Therapy:  Vancomycin trough level 15-20 mcg/ml  Plan:  Vancomycin 750mg  IV x 1 then 500mg  IV q48h. Cefepime 1g IV x 1 ordered in ED. If continued, I would recommend 500mg  IV q24h.   Crystal S. Alford Highland, PharmD, BCPS Clinical Staff Pharmacist Pager [phone]  Eilene Ghazi Stillinger 10/19/2013,3:05 PM  Addendum: Vancomycin and cefepime to continue once admitted Will continue with vancomycin 500 q48 hours and cefepime 500mg  q24 - initial doses received in ED already. 10/19/2013 5:16 PM [place] Pulling Marlaine Hind

## 2013-10-19 NOTE — ED Notes (Signed)
Family requesting something for anxiety and more fluids for patient. Pt appears calm at present time, eyes closed and resting. Family updated on resulting labs and plan of care. Dr. Tamera Punt made aware of family request for anxiety meds and more fluids. RN requesting IV abx due to pt not wanting to take meds PO at home.   Family given water and updated.

## 2013-10-19 NOTE — ED Notes (Signed)
Family at bedside. sts not to do in and out cath and wait for pt to pee.

## 2013-10-19 NOTE — H&P (Signed)
[facility]                                                                                    Patient Demographics  Theresa Rodriguez, is a 80 y.o. female  CSN: [id number]  MRN: [id number]  DOB - May 28, 1922  Admit Date - 10/19/2013  Outpatient Primary MD for the patient is REED, TIFFANY, DO   With History of -  Past Medical History  Diagnosis Date  . Vitamin D deficiency   . Insomnia, unspecified   . Other specified cardiac dysrhythmias(427.89)   . Nausea with vomiting   . Dysphagia, oral phase   . Loss of weight   . Hyperpotassemia   . Dizziness and giddiness   . Chronic lymphocytic thyroiditis   . Other and unspecified hyperlipidemia   . Hypothyroidism   . Unspecified protein-calorie malnutrition   . Osteoarthritis, knee   . Senile osteoporosis   . Anxiety and depression   . Breast cancer     "right" (09/22/2013)  . Benign hypertension     "took off BP meds this fall" (09/22/2013)  . Alzheimer's disease     "probably moderate" (09/22/2013)  . Anxiety   . Seizures     "might have had one today; not sure" (09/22/2013      Past Surgical History  Procedure Laterality Date  . Eye surgery    . Knee surgery Right 1940's    "cyst on her knee" (09/22/2013)  . Breast lumpectomy Right 1995    in for   Chief Complaint  Patient presents with  . Bronchitis     HPI  Theresa Rodriguez  is a 80 y.o. female, who lives at home with her daughter but was recently admitted last month to the hospital for dysphagia presenting with 2 days history of cough and shortness of breath. The cough brought today and the patient has been having yellowish sputum production. The patient was treated 1 week ago for UTI with Cipro later on switched to Zithromax because of possible bronchitis however yesterday patient was changed to Levaquin to cover UTI and bronchitis. Patient denies chest pains but reports shortness of breath. Maximum temperature in the emergency room was 99.4.  Patient has history of dysphagia but nonspecific.    Review of Systems    In addition to the HPI above,  No Fever-chills, No Headache, No changes with Vision or hearing, No problems swallowing food or Liquids, No Chest pain, positive for Cough and Shortness of Breath, No Abdominal pain, No Nausea or Vommitting, Bowel movements are regular, No Blood in stool or Urine, No dysuria, No new skin rashes or bruises, No new joints pains-aches,  No new weakness, tingling, numbness in any extremity, No recent weight gain or loss, No polyuria, polydypsia or polyphagia, No significant Mental Stressors.  A full 10 point Review of Systems was done, except as stated above, all other Review of Systems were negative.   Social History History  Substance Use Topics  . Smoking status: Former Smoker -- 1.00 packs/day for 20 years    Types: Cigarettes  . Smokeless tobacco: Never Used     Comment: 09/22/2013 "quite smoking ~ 1960's"  .  Alcohol Use: 4.2 oz/week    7 Cans of beer per week     Comment: 09/22/2013 "beer q hs"     Family History Family History  Problem Relation Age of Onset  . Pneumonia Mother   . Heart attack Father   . Pneumonia Brother      Prior to Admission medications   Medication Sig Start Date End Date Taking? Authorizing Provider  acetaminophen (TYLENOL) 500 MG tablet Take 500-1,000 mg by mouth every 8 (eight) hours as needed for mild pain.    Yes Historical Provider, MD  ALPRAZolam (XANAX) 0.25 MG tablet Take 1 tablet (0.25 mg total) by mouth 2 (two) times daily as needed for anxiety. 10/14/13  Yes Estill Dooms, MD  Cholecalciferol (VITAMIN D3 ADULT GUMMIES PO) Take 2 tablets by mouth daily.    Yes Historical Provider, MD  diclofenac sodium (VOLTAREN) 1 % GEL 4 g 4 (four) times daily.    Yes Historical Provider, MD  donepezil (ARICEPT) 5 MG tablet Take 5 mg by mouth 2 (two) times daily.   Yes Historical Provider, MD  levofloxacin (LEVAQUIN) 500 MG tablet Take 500  mg by mouth daily. Take for 7 days. First dose on 10/18/12   Yes Historical Provider, MD    Allergies  Allergen Reactions  . Tessalon [Benzonatate]     Hallucinations     Physical Exam  Vitals  Blood pressure 119/68, pulse 85, temperature 99.4 F (37.4 C), temperature source Oral, resp. rate 16, height 5\' 2"  (1.575 m), weight 40.824 kg (90 lb), SpO2 94.00%.   1. General elderly white female lying in bed, chronically ill, mild respiratory distress  2. Normal affect and insight, Not Suicidal or Homicidal,  3. No F.N deficits, ALL C.Nerves Intact, Strength 5/5 all 4 extremities, Sensation intact all 4 extremities, Plantars down going.  4. Ears and Eyes appear Normal, Conjunctivae clear, PERRLA. Moist Oral Mucosa.  5. Supple Neck, No JVD, No cervical lymphadenopathy appriciated, No Carotid Bruits.  6. Symmetrical Chest wall movement, decreased breath sounds bilaterally right more than left.  7. RRR, No Gallops, Rubs or Murmurs, No Parasternal Heave.  8. Positive Bowel Sounds, Abdomen Soft, Non tender, No organomegaly appriciated,No rebound -guarding or rigidity.  9.  No Cyanosis, Normal Skin Turgor, No Skin Rash or Bruise.  10. Good muscle tone,  joints appear normal , no effusions, Normal ROM.  11. No Palpable Lymph Nodes in Neck or Axillae    Data Review  CBC  Recent Labs Lab 10/19/13 1223  WBC 15.5*  HGB 9.7*  HCT 29.5*  PLT 478*  MCV 85.8  MCH 28.2  MCHC 32.9  RDW 14.3  LYMPHSABS 1.3  MONOABS 0.6  EOSABS 0.0  BASOSABS 0.0   ------------------------------------------------------------------------------------------------------------------  Chemistries   Recent Labs Lab 10/19/13 1223  NA 133*  K 3.9  CL 92*  CO2 28  GLUCOSE 90  BUN 41*  CREATININE 1.42*  CALCIUM 9.0   ------------------------------------------------------------------------------------------------------------------ estimated creatinine clearance is 17.3 ml/min (by C-G formula  based on Cr of 1.42). ------------------------------------------------------------------------------------------------------------------   ---------------------------------------------------------------------------------------------------------------  Urinalysis    Component Value Date/Time   COLORURINE YELLOW 10/19/2013 1410   APPEARANCEUR TURBID* 10/19/2013 1410   LABSPEC 1.016 10/19/2013 1410   PHURINE 5.0 10/19/2013 1410   GLUCOSEU NEGATIVE 10/19/2013 1410   HGBUR NEGATIVE 10/19/2013 1410   BILIRUBINUR NEGATIVE 10/19/2013 1410   KETONESUR NEGATIVE 10/19/2013 1410   PROTEINUR 30* 10/19/2013 1410   UROBILINOGEN 0.2 10/19/2013 1410   NITRITE NEGATIVE 10/19/2013 1410  LEUKOCYTESUR SMALL* 10/19/2013 1410    ----------------------------------------------------------------------------------------------------------------     Imaging results:   Dg Chest 2 View  10/19/2013   CLINICAL DATA:  Cough and congestion, altered mental status  EXAM: CHEST  2 VIEW  COMPARISON:  No comparisons  FINDINGS: Normal cardiac silhouette. There is right lower lobe airspace disease. Lungs are hyperinflated. No pneumothorax. Small amount airspace disease also noted in the right upper lobe superior to the horizontal fissure.  IMPRESSION: Right lower lobe pneumonia with mild involvement of the right upper lobe additionally.   Electronically Signed   By: Suzy Bouchard M.D.   On: 10/19/2013 14:48    My personal review of EKG: Paced rhythm, no significant changes    Assessment & Plan  1. right-sided pneumonia; will treat as healthcare associated pneumonia since patient was in the hospital last month    Vancomycin and cefepime ; nebulizer treatments please follow cultures 2. Urinary tract infection    Seems to have resolved 3. Sick sinus syndrome    Status post pacemaker placement 4. Dysphagia and protein calorie malnutrition    Place on dysphagia diet, to be evaluated in a.m. 5. History of breast cancer status post right  breast lumpectomy 6. Alzheimer's dementia  DVT Prophylaxis Lovenox  AM Labs Ordered, also please review Full Orders  Family Communication: Admission, patients condition and plan of care including tests being ordered have been discussed with the patient and children who indicate understanding and agree with the plan and Code Status.  Code Status DO NOT RESUSCITATE  Disposition Plan: Home with home health  Time spent in minutes : 33 minutes  Condition GUARDED

## 2013-10-19 NOTE — ED Notes (Signed)
Pharmacy electrically notified for maxipime and vancocin

## 2013-10-20 DIAGNOSIS — I498 Other specified cardiac arrhythmias: Secondary | ICD-10-CM

## 2013-10-20 DIAGNOSIS — J209 Acute bronchitis, unspecified: Secondary | ICD-10-CM

## 2013-10-20 LAB — BASIC METABOLIC PANEL
BUN: 26 mg/dL — ABNORMAL HIGH (ref 6–23)
CO2: 22 mEq/L (ref 19–32)
Calcium: 8.2 mg/dL — ABNORMAL LOW (ref 8.4–10.5)
Chloride: 96 mEq/L (ref 96–112)
Creatinine, Ser: 1.18 mg/dL — ABNORMAL HIGH (ref 0.50–1.10)
GFR calc Af Amer: 46 mL/min — ABNORMAL LOW (ref >90)
GFR, EST NON AFRICAN AMERICAN: 40 mL/min — AB (ref >90)
GLUCOSE: 99 mg/dL (ref 70–99)
Potassium: 4.1 mEq/L (ref 3.7–5.3)
SODIUM: 133 meq/L — AB (ref 137–147)

## 2013-10-20 LAB — CBC
HCT: 27.6 % — ABNORMAL LOW (ref 36.0–46.0)
Hemoglobin: 9 g/dL — ABNORMAL LOW (ref 12.0–15.0)
MCH: 27.8 pg (ref 26.0–34.0)
MCHC: 32.6 g/dL (ref 30.0–36.0)
MCV: 85.2 fL (ref 78.0–100.0)
PLATELETS: 444 10*3/uL — AB (ref 150–400)
RBC: 3.24 MIL/uL — AB (ref 3.87–5.11)
RDW: 14.1 % (ref 11.5–15.5)
WBC: 15.5 10*3/uL — ABNORMAL HIGH (ref 4.0–10.5)

## 2013-10-20 LAB — EXPECTORATED SPUTUM ASSESSMENT W GRAM STAIN, RFLX TO RESP C

## 2013-10-20 LAB — URINE CULTURE
CULTURE: NO GROWTH
Colony Count: NO GROWTH

## 2013-10-20 LAB — LEGIONELLA ANTIGEN, URINE: Legionella Antigen, Urine: NEGATIVE

## 2013-10-20 LAB — STREP PNEUMONIAE URINARY ANTIGEN: Strep Pneumo Urinary Antigen: POSITIVE — AB

## 2013-10-20 MED ORDER — WHITE PETROLATUM GEL
Status: AC
  Filled 2013-10-20: qty 5

## 2013-10-20 NOTE — Progress Notes (Signed)
Patient ID: Theresa Rodriguez  female  ACZ:[id number]    DOB: 1922-05-14    DOA: 10/19/2013  PCP: Hollace Kinnier, DO  Assessment/Plan:  Principal Problem:  right-sided pneumonia/ HCAP: patient was in the hospital last month  Continue Vancomycin and cefepime ; nebulizer treatments  - Urine Legionella antigen negative, urine strep antigen positive Swallow evaluation done, no dysphagia/aspiration issues  2. Urinary tract infection Seems to have resolved   3. Sick sinus syndrome Status post pacemaker placement   4. Dysphagia and protein calorie malnutrition  - Currently on regular diet, evaluated by swallow study/speech therapist  5. History of breast cancer status post right breast lumpectomy   6. Alzheimer's dementia -Continue Azopt, Xanax   DVT Prophylaxis:Lovenox  Code Status:  Family Communication: Daughter at the bedside  Disposition:    Subjective: Per daughter, she is feeling somewhat better today, breathing is improving, afebrile  Objective: Weight change:   Intake/Output Summary (Last 24 hours) at 10/20/13 1331 Last data filed at 10/20/13 0600  Gross per 24 hour  Intake 926.25 ml  Output    300 ml  Net 626.25 ml   Blood pressure 101/57, pulse 86, temperature 97.7 F (36.5 C), temperature source Oral, resp. rate 20, height 5\' 2"  (1.575 m), weight 40.824 kg (90 lb), SpO2 97.00%.  Physical Exam: General: Alert and awake, not in any acute distress, Frail  CVS: S1-S2 clear, no murmur rubs or gallops Chest:  decreased breath sound at the bases  Abdomen: soft nontender, nondistended, normal bowel sounds  Extremities: no cyanosis, clubbing or edema noted bilaterally Neuro: Cranial nerves II-XII intact, no focal neurological deficits  Lab Results: Basic Metabolic Panel:  Recent Labs Lab 10/19/13 1223 10/20/13 0625  NA 133* 133*  K 3.9 4.1  CL 92* 96  CO2 28 22  GLUCOSE 90 99  BUN 41* 26*  CREATININE 1.42* 1.18*  CALCIUM 9.0 8.2*   Liver Function  Tests: No results found for this basename: AST, ALT, ALKPHOS, BILITOT, PROT, ALBUMIN,  in the last 168 hours No results found for this basename: LIPASE, AMYLASE,  in the last 168 hours No results found for this basename: AMMONIA,  in the last 168 hours CBC:  Recent Labs Lab 10/19/13 1223 10/20/13 0625  WBC 15.5* 15.5*  NEUTROABS 13.6*  --   HGB 9.7* 9.0*  HCT 29.5* 27.6*  MCV 85.8 85.2  PLT 478* 444*   Cardiac Enzymes: No results found for this basename: CKTOTAL, CKMB, CKMBINDEX, TROPONINI,  in the last 168 hours BNP: No components found with this basename: POCBNP,  CBG: No results found for this basename: GLUCAP,  in the last 168 hours   Micro Results: No results found for this or any previous visit (from the past 240 hour(s)).  Studies/Results: Dg Chest 2 View  10/19/2013   CLINICAL DATA:  Cough and congestion, altered mental status  EXAM: CHEST  2 VIEW  COMPARISON:  No comparisons  FINDINGS: Normal cardiac silhouette. There is right lower lobe airspace disease. Lungs are hyperinflated. No pneumothorax. Small amount airspace disease also noted in the right upper lobe superior to the horizontal fissure.  IMPRESSION: Right lower lobe pneumonia with mild involvement of the right upper lobe additionally.   Electronically Signed   By: Suzy Bouchard M.D.   On: 10/19/2013 14:48   Dg Hip Complete Right  09/22/2013   CLINICAL DATA:  Weakness and pain right hip  EXAM: RIGHT HIP - COMPLETE 2+ VIEW  COMPARISON:  None.  FINDINGS: The pelvic bones  are intact. There is no evidence of fracture or dislocation involving proximal right femur. There is mild right hip joint narrowing and osteophyte formation. There are no femoral head contour deformities noted on the right.  IMPRESSION: Mild arthritis   Electronically Signed   By: Skipper Cliche M.D.   On: 09/22/2013 16:48   Ct Head Wo Contrast  09/22/2013   CLINICAL DATA:  Possible seizure.  EXAM: CT HEAD WITHOUT CONTRAST  TECHNIQUE: Contiguous  axial images were obtained from the base of the skull through the vertex without intravenous contrast.  COMPARISON:  Head CT scan 11/18/2009.  FINDINGS: There is chronic microvascular ischemic change and marked atrophy. No evidence of acute abnormality including infarction, hemorrhage, mass lesion, mass effect, midline shift or abnormal extra-axial fluid collection is identified. There is no hydrocephalus or pneumocephalus. The calvarium is intact.  IMPRESSION: No acute finding.   Electronically Signed   By: Inge Rise M.D.   On: 09/22/2013 12:51    Medications: Scheduled Meds: . ceFEPime (MAXIPIME) IV  500 mg Intravenous Q24H  . donepezil  5 mg Oral BID  . enoxaparin (LOVENOX) injection  20 mg Subcutaneous Q24H  . guaiFENesin  600 mg Oral BID  . [START ON 10/21/2013] vancomycin  500 mg Intravenous Q48H  . white petrolatum          LOS: 1 day   RAI,RIPUDEEP M.D. [facility] 10/20/2013, 1:31 PM Pager: [phone]  If 7PM-7AM, please contact night-coverage [email or web address] Password [email or web address]

## 2013-10-20 NOTE — Evaluation (Signed)
Clinical/Bedside Swallow Evaluation Patient Details  Name: Theresa Rodriguez MRN: [id number] Date of Birth: 05-Jan-1922  Today's Date: 10/20/2013 Time: 1030-1105 SLP Time Calculation (min): 35 min  Past Medical History:  Past Medical History  Diagnosis Date  . Vitamin D deficiency   . Insomnia, unspecified   . Other specified cardiac dysrhythmias(427.89)   . Nausea with vomiting   . Dysphagia, oral phase   . Loss of weight   . Hyperpotassemia   . Dizziness and giddiness   . Chronic lymphocytic thyroiditis   . Other and unspecified hyperlipidemia   . Hypothyroidism   . Unspecified protein-calorie malnutrition   . Osteoarthritis, knee   . Senile osteoporosis   . Anxiety and depression   . Breast cancer     "right" (09/22/2013)  . Benign hypertension     "took off BP meds this fall" (09/22/2013)  . Alzheimer's disease     "probably moderate" (09/22/2013)  . Anxiety   . Seizures     "might have had one today; not sure" (09/22/2013   Past Surgical History:  Past Surgical History  Procedure Laterality Date  . Eye surgery    . Knee surgery Right 1940's    "cyst on her knee" (09/22/2013)  . Breast lumpectomy Right 1995   HPI:  Theresa Rodriguez is a 80 y.o. female, who lives at home with her daughter but was recently admitted last month to the hospital for dysphagia presenting with 2 days history of cough and shortness of breath.  At thiat time, pt was not seen by SLP but was documented to have an 'oral phase' dysphagia in the chart and was on a dys3/thin liquids diet. There was also no CXR on that admit.  The cough brought today and the patient has been having yellowish sputum production.  CXR shows Right lower lobe pneumonia with mild involvement of the right upper lobe additionally. The patient was treated 1 week ago for UTI. Patient has history of dysphagia but nonspecific. Decreased by mouth intake and increased fatigue. Pt did have an outpatient MBS in 6/11 that showed mild stasis  with cracker in thoracic esophagus, pt recommended to follow soldis with liquids, otherwise WNL.    Assessment / Plan / Recommendation Clinical Impression  Pt demonstrates no immediate, overt signs of aspiration when consuming thin liquids or solids. She does have a consistent, late cough following each bite and sip but she is also coughing at baseline, so it is difficult to detrmine if this could could be related to a primary esophageal dysphagia. There are several other reports that could indicate a primary esophageal issue including: difficulty swallowing pills with frequent expectoration of pills, recent acute complaint of burning in the chest, current dx of RLL pna, poor PO intake in pt with dementia. Esophageal dysphagia can sometimes result in laryngopharyngeal reflux which can cause an aspiration pna. This could be a stretch for this pt. It is difficult to determine without objective testing and even then may be more likely to affect pt only intermittently.   Discussed findings with dtr who feels she does not want her mother to undergo any testing or procedures at this time. SLP offered some basic aspiration and esopahgeal precautions and suggested giving pills crushed in puree to facilitate transit, which dtr agreed to do. Otherwise no SLP f/u is requested. Dtr will request f/u via MD if further concerns arise. Will sign off.     Aspiration Risk  Moderate    Diet Recommendation Regular;Thin liquid  Liquid Administration via: Cup;Straw Medication Administration: Crushed with puree Supervision: Patient able to self feed;Intermittent supervision to cue for compensatory strategies Compensations: Slow rate;Small sips/bites;Follow solids with liquid Postural Changes and/or Swallow Maneuvers: Seated upright 90 degrees;Upright 30-60 min after meal;Out of bed for meals    Other  Recommendations Oral Care Recommendations: Oral care BID   Follow Up Recommendations  None    Frequency and  Duration        Pertinent Vitals/Pain NA    SLP Swallow Goals     Swallow Study Prior Functional Status       General HPI: Theresa Rodriguez is a 80 y.o. female, who lives at home with her daughter but was recently admitted last month to the hospital for dysphagia presenting with 2 days history of cough and shortness of breath.  At thiat time, pt was not seen by SLP but was documented to have an 'oral phase' dysphagia in the chart and was on a dys3/thin liquids diet. There was also no CXR on that admit.  The cough brought today and the patient has been having yellowish sputum production.  CXR shows Right lower lobe pneumonia with mild involvement of the right upper lobe additionally. The patient was treated 1 week ago for UTI. Patient has history of dysphagia but nonspecific. Decreased by mouth intake and increased fatigue. Pt did have an outpatient MBS in 6/11 that showed mild stasis with cracker in thoracic esophagus, pt recommended to follow soldis with liquids, otherwise WNL.  Type of Study: Bedside swallow evaluation Previous Swallow Assessment: none Diet Prior to this Study: Regular;Thin liquids Temperature Spikes Noted: No History of Recent Intubation: No Behavior/Cognition: Alert;Cooperative Oral Cavity - Dentition: Adequate natural dentition Self-Feeding Abilities: Able to feed self Patient Positioning: Upright in bed Baseline Vocal Quality: Clear Volitional Cough: Strong Volitional Swallow: Able to elicit    Oral/Motor/Sensory Function Overall Oral Motor/Sensory Function: Appears within functional limits for tasks assessed   Ice Chips     Thin Liquid Thin Liquid: Within functional limits Presentation: Cup;Straw;Self Fed    Nectar Thick Nectar Thick Liquid: Not tested   Honey Thick Honey Thick Liquid: Not tested   Puree Puree: Within functional limits Presentation: Self Fed   Solid   GO    Solid: Within functional limits Presentation: [place]  Greogry Goodwyn, MA CCC-SLP [phone]  Lynann Beaver 10/20/2013,11:18 AM

## 2013-10-21 MED ORDER — BOOST / RESOURCE BREEZE PO LIQD
1.0000 | Freq: Two times a day (BID) | ORAL | Status: DC
  Administered 2013-10-21 – 2013-10-22 (×2): 1 via ORAL

## 2013-10-21 MED ORDER — ENSURE PUDDING PO PUDG
1.0000 | ORAL | Status: DC
  Administered 2013-10-21: 1 via ORAL

## 2013-10-21 NOTE — Progress Notes (Signed)
Patient ID: Theresa Rodriguez  female  ZOX:[id number]    DOB: 04/12/1922    DOA: 10/19/2013  PCP: Bufford SpikesEED, TIFFANY, DO  Assessment/Plan:  Principal Problem:  right-sided pneumonia/ HCAP: patient was in the hospital last month, improving   - Continue Vancomycin and cefepime ; nebulizer treatments  - Urine Legionella antigen negative, urine strep antigen positive - Swallow evaluation done, no dysphagia/aspiration issues - Recheck CBC in a.m., afebrile  2. Urinary tract infection Seems to have resolved   3. Sick sinus syndrome Status post pacemaker placement   4. Dysphagia and protein calorie malnutrition  - Currently on regular diet, evaluated by swallow study/speech therapist  5. History of breast cancer status post right breast lumpectomy   6. Alzheimer's dementia -Continue Aricept, Xanax   DVT Prophylaxis:Lovenox  Code Status:  Family Communication: Discussed in detail with Daughter at the bedside  Disposition: Hopefully tomorrow a.m.    Subjective: Per daughter, she is improving, afebrile, dyspnea has improved. Still some coughing Objective: Weight change:   Intake/Output Summary (Last 24 hours) at 10/21/13 1414 Last data filed at 10/20/13 2200  Gross per 24 hour  Intake   1200 ml  Output      0 ml  Net   1200 ml   Blood pressure 126/70, pulse 71, temperature 97.4 F (36.3 C), temperature source Oral, resp. rate 18, height 5\' 2"  (1.575 m), weight 40.824 kg (90 lb), SpO2 98.00%.  Physical Exam: General: Alert and awake, NAD, Frail  CVS: S1-S2 clear, Chest:  decreased breath sound at the bases  Abdomen: soft nontender, nondistended, normal bowel sounds  Extremities: no cyanosis, clubbing or edema noted bilaterally   Lab Results: Basic Metabolic Panel:  Recent Labs Lab 10/19/13 1223 10/20/13 0625  NA 133* 133*  K 3.9 4.1  CL 92* 96  CO2 28 22  GLUCOSE 90 99  BUN 41* 26*  CREATININE 1.42* 1.18*  CALCIUM 9.0 8.2*   Liver Function Tests: No results  found for this basename: AST, ALT, ALKPHOS, BILITOT, PROT, ALBUMIN,  in the last 168 hours No results found for this basename: LIPASE, AMYLASE,  in the last 168 hours No results found for this basename: AMMONIA,  in the last 168 hours CBC:  Recent Labs Lab 10/19/13 1223 10/20/13 0625  WBC 15.5* 15.5*  NEUTROABS 13.6*  --   HGB 9.7* 9.0*  HCT 29.5* 27.6*  MCV 85.8 85.2  PLT 478* 444*   Cardiac Enzymes: No results found for this basename: CKTOTAL, CKMB, CKMBINDEX, TROPONINI,  in the last 168 hours BNP: No components found with this basename: POCBNP,  CBG: No results found for this basename: GLUCAP,  in the last 168 hours   Micro Results: Recent Results (from the past 240 hour(s))  URINE CULTURE     Status: None   Collection Time    10/19/13  2:10 PM      Result Value Range Status   Specimen Description URINE, CATHETERIZED   Final   Special Requests NONE   Final   Culture  Setup Time     Final   Value: 10/20/2013 03:11     Performed at [facility]   Colony Count     Final   Value: NO GROWTH     [facility]formed [facility]   Culture     Final   Value: NO GROWTH     Performed at [facility]   Report Status 10/20/2013 FINAL   Final  CULTURE, BLOOD (ROUTINE X 2)  Status: None   Collection Time    10/19/13  7:52 PM      Result Value Range Status   Specimen Description BLOOD LEFT ARM   Final   Special Requests BOTTLES DRAWN AEROBIC ONLY 10CC   Final   Culture  Setup Time     Final   Value: 10/20/2013 01:08     Performed at [facility]   Culture     Final   Value:        BLOOD CULTURE RECEIVED NO GROWTH TO DATE CULTURE WILL BE HELD FOR 5 DAYS BEFORE ISSUING A FINAL NEGATIVE REPORT     Performed at [facility]   Report Status PENDING   Incomplete  CULTURE, BLOOD (ROUTINE X 2)     Status: None   Collection Time    10/19/13  8:00 PM      Result Value Range Status   Specimen Description BLOOD RIGHT HAND   Final   Special  Requests BOTTLES DRAWN AEROBIC ONLY 10CC   Final   Culture  Setup Time     Final   Value: 10/20/2013 01:08     Performed at [facility]   Culture     Final   Value:        BLOOD CULTURE RECEIVED NO GROWTH TO DATE CULTURE WILL BE HELD FOR 5 DAYS BEFORE ISSUING A FINAL NEGATIVE REPORT     Performed at [facility]   Report Status PENDING   Incomplete  CULTURE, EXPECTORATED SPUTUM-ASSESSMENT     Status: None   Collection Time    10/20/13  1:00 PM      Result Value Range Status   Specimen Description SPUTUM   Final   Special Requests NONE   Final   Sputum evaluation     Final   Value: THIS SPECIMEN IS ACCEPTABLE. RESPIRATORY CULTURE REPORT TO FOLLOW.   Report Status 10/20/2013 FINAL   Final  CULTURE, RESPIRATORY (NON-EXPECTORATED)     Status: None   Collection Time    10/20/13  1:00 PM      Result Value Range Status   Specimen Description SPUTUM   Final   Special Requests NONE   Final   Gram Stain     Final   Value: ABUNDANT WBC PRESENT,BOTH PMN AND MONONUCLEAR     FEW SQUAMOUS EPITHELIAL CELLS PRESENT     ABUNDANT GRAM POSITIVE RODS     RARE GRAM POSITIVE COCCI     IN PAIRS   Culture     Final   Value: NORMAL OROPHARYNGEAL FLORA     Performed at [facility]   Report Status PENDING   Incomplete    Studies/Results: Dg Chest 2 View  10/19/2013   CLINICAL DATA:  Cough and congestion, altered mental status  EXAM: CHEST  2 VIEW  COMPARISON:  No comparisons  FINDINGS: Normal cardiac silhouette. There is right lower lobe airspace disease. Lungs are hyperinflated. No pneumothorax. Small amount airspace disease also noted in the right upper lobe superior to the horizontal fissure.  IMPRESSION: Right lower lobe pneumonia with mild involvement of the right upper lobe additionally.   Electronically Signed   By: Suzy Bouchard M.D.   On: 10/19/2013 14:48   Dg Hip Complete Right  09/22/2013   CLINICAL DATA:  Weakness and pain right hip  EXAM: RIGHT HIP - COMPLETE 2+  VIEW  COMPARISON:  None.  FINDINGS: The pelvic bones are intact. There is no evidence of  fracture or dislocation involving proximal right femur. There is mild right hip joint narrowing and osteophyte formation. There are no femoral head contour deformities noted on the right.  IMPRESSION: Mild arthritis   Electronically Signed   By: Skipper Cliche M.D.   On: 09/22/2013 16:48   Ct Head Wo Contrast  09/22/2013   CLINICAL DATA:  Possible seizure.  EXAM: CT HEAD WITHOUT CONTRAST  TECHNIQUE: Contiguous axial images were obtained from the base of the skull through the vertex without intravenous contrast.  COMPARISON:  Head CT scan 11/18/2009.  FINDINGS: There is chronic microvascular ischemic change and marked atrophy. No evidence of acute abnormality including infarction, hemorrhage, mass lesion, mass effect, midline shift or abnormal extra-axial fluid collection is identified. There is no hydrocephalus or pneumocephalus. The calvarium is intact.  IMPRESSION: No acute finding.   Electronically Signed   By: Inge Rise M.D.   On: 09/22/2013 12:51    Medications: Scheduled Meds: . ceFEPime (MAXIPIME) IV  500 mg Intravenous Q24H  . donepezil  5 mg Oral BID  . enoxaparin (LOVENOX) injection  20 mg Subcutaneous Q24H  . feeding supplement (ENSURE)  1 Container Oral Q24H  . feeding supplement (RESOURCE BREEZE)  1 Container Oral BID PC  . guaiFENesin  600 mg Oral BID  . vancomycin  500 mg Intravenous Q48H      LOS: 2 days   Ande Therrell M.D. [facility] 10/21/2013, 2:14 PM Pager: [phone]  If 7PM-7AM, please contact night-coverage [email or web address] Password [email or web address]

## 2013-10-21 NOTE — Progress Notes (Signed)
INITIAL NUTRITION ASSESSMENT  DOCUMENTATION CODES Per approved criteria  -Underweight   INTERVENTION: Provide Ensure Pudding and Magic Cup once daily Provide [employer] once daily Provide Snack once daily Encouraged PO intake  NUTRITION DIAGNOSIS: Inadequate oral intake related to poor appetite as evidenced by po intake <50% of meals and 2% weight loss in less than one month.   Goal: Pt to meet >/= 90% of their estimated nutrition needs    Monitor:  PO intake Weight Labs  Reason for Assessment: Low BMI  80 y.o. female  Admitting Dx: Healthcare-associated pneumonia  ASSESSMENT: 80 y.o. female, who lives at home with her daughter but was recently admitted last month to the hospital for dysphagia presenting with 2 days history of cough and shortness of breath.   Pt with underweight BMI of 16.5 kg/(m^2). Per pt's daughter pt usually weighs 95-98 lbs and pt never has much of an appetite but, she drinks nutritional shakes and eats protein bars during the day and usually eats a good dinner. Per pt's daughter pt has been eating much less since the 09/22/13 and pt lost down to 88 lbs.  Daughter works for [employer] which makes low calorie nutrition shakes but, daughter adds peanut butter and ice cream to provide extra calories. Daughter states she will bring pt nutritional shakes while pt is hospitalized. Pt willing to try additional supplements while admitted. Encouraged continued use of supplements as well as increased PO intake with snacks in between meals.  Per nursing notes pt is eating 50% of meals; daughter reports pt ate <25% of breakfast today.   Nutrition Focused Physical Exam:  Subcutaneous Fat:  Orbital Region: wnl Upper Arm Region: mild wasting Thoracic and Lumbar Region: NA  Muscle:  Temple Region: mild wasting Clavicle Bone Region: severe wasting Clavicle and Acromion Bone Region: moderate wasting Scapular Bone Region: NA Dorsal Hand: severe  wasting Patellar Region: mild wasting Anterior Thigh Region: mild wasting Posterior Calf Region: wnl  Edema: none  Height: Ht Readings from Last 1 Encounters:  10/19/13 5\' 2"  (1.575 m)    Weight: Wt Readings from Last 1 Encounters:  10/19/13 90 lb (40.824 kg)    Ideal Body Weight: 110 lbs  % Ideal Body Weight: 82%  Wt Readings from Last 10 Encounters:  10/19/13 90 lb (40.824 kg)  10/14/13 88 lb (39.917 kg)  09/23/13 92 lb 8 oz (41.958 kg)  07/31/13 90 lb (40.824 kg)  06/30/13 92 lb 12.8 oz (42.094 kg)  02/27/13 94 lb (42.638 kg)    Usual Body Weight: 95-98 lbs  % Usual Body Weight: 92-95%  BMI:  Body mass index is 16.46 kg/(m^2).  Estimated Nutritional Needs: Kcal: 1275-1460 Protein: 52-62 grams Fluid: 1.2-1.4 L/day  Skin: WDL  Diet Order: Cardiac  EDUCATION NEEDS: -No education needs identified at this time   Intake/Output Summary (Last 24 hours) at 10/21/13 1316 Last data filed at 10/20/13 2200  Gross per 24 hour  Intake   1200 ml  Output      0 ml  Net   1200 ml    Last BM: 1/2   Labs:   Recent Labs Lab 10/19/13 1223 10/20/13 0625  NA 133* 133*  K 3.9 4.1  CL 92* 96  CO2 28 22  BUN 41* 26*  CREATININE 1.42* 1.18*  CALCIUM 9.0 8.2*  GLUCOSE 90 99    CBG (last 3)  No results found for this basename: GLUCAP,  in the last 72 hours  Scheduled Meds: . ceFEPime (MAXIPIME)  IV  500 mg Intravenous Q24H  . donepezil  5 mg Oral BID  . enoxaparin (LOVENOX) injection  20 mg Subcutaneous Q24H  . guaiFENesin  600 mg Oral BID  . vancomycin  500 mg Intravenous Q48H    Continuous Infusions:   Past Medical History  Diagnosis Date  . Vitamin D deficiency   . Insomnia, unspecified   . Other specified cardiac dysrhythmias(427.89)   . Nausea with vomiting   . Dysphagia, oral phase   . Loss of weight   . Hyperpotassemia   . Dizziness and giddiness   . Chronic lymphocytic thyroiditis   . Other and unspecified hyperlipidemia   .  Hypothyroidism   . Unspecified protein-calorie malnutrition   . Osteoarthritis, knee   . Senile osteoporosis   . Anxiety and depression   . Breast cancer     "right" (09/22/2013)  . Benign hypertension     "took off BP meds this fall" (09/22/2013)  . Alzheimer's disease     "probably moderate" (09/22/2013)  . Anxiety   . Seizures     "might have had one today; not sure" (09/22/2013    Past Surgical History  Procedure Laterality Date  . Eye surgery    . Knee surgery Right 1940's    "cyst on her knee" (09/22/2013)  . Breast lumpectomy Right 1995    Pryor Ochoa RD, LDN Inpatient Clinical Dietitian Pager: [phone] After Hours Pager: [phone]

## 2013-10-22 LAB — BASIC METABOLIC PANEL
BUN: 12 mg/dL (ref 6–23)
CALCIUM: 8.6 mg/dL (ref 8.4–10.5)
CO2: 23 mEq/L (ref 19–32)
CREATININE: 0.88 mg/dL (ref 0.50–1.10)
Chloride: 99 mEq/L (ref 96–112)
GFR calc Af Amer: 66 mL/min — ABNORMAL LOW (ref >90)
GFR, EST NON AFRICAN AMERICAN: 57 mL/min — AB (ref >90)
GLUCOSE: 88 mg/dL (ref 70–99)
Potassium: 4.2 mEq/L (ref 3.7–5.3)
SODIUM: 135 meq/L — AB (ref 137–147)

## 2013-10-22 LAB — CBC
HCT: 28.2 % — ABNORMAL LOW (ref 36.0–46.0)
HEMOGLOBIN: 9.4 g/dL — AB (ref 12.0–15.0)
MCH: 28.7 pg (ref 26.0–34.0)
MCHC: 33.3 g/dL (ref 30.0–36.0)
MCV: 86 fL (ref 78.0–100.0)
Platelets: 548 10*3/uL — ABNORMAL HIGH (ref 150–400)
RBC: 3.28 MIL/uL — ABNORMAL LOW (ref 3.87–5.11)
RDW: 13.8 % (ref 11.5–15.5)
WBC: 10.7 10*3/uL — ABNORMAL HIGH (ref 4.0–10.5)

## 2013-10-22 LAB — CULTURE, RESPIRATORY

## 2013-10-22 LAB — CULTURE, RESPIRATORY W GRAM STAIN: Culture: NORMAL

## 2013-10-22 MED ORDER — GUAIFENESIN-DM 100-10 MG/5ML PO SYRP: 5.0000 mL | ORAL_SOLUTION | ORAL | Status: DC | PRN

## 2013-10-22 MED ORDER — GUAIFENESIN ER 600 MG PO TB12: 600.0000 mg | ORAL_TABLET | Freq: Two times a day (BID) | ORAL | Status: DC

## 2013-10-22 MED ORDER — VANCOMYCIN HCL 500 MG IV SOLR
500.0000 mg | INTRAVENOUS | Status: DC
  Filled 2013-10-22: qty 500

## 2013-10-22 MED ORDER — AMOXICILLIN 500 MG PO CAPS: 1000.0000 mg | ORAL_CAPSULE | Freq: Three times a day (TID) | ORAL | Status: DC

## 2013-10-22 MED ORDER — BISACODYL 10 MG RE SUPP
10.0000 mg | Freq: Once | RECTAL | Status: DC
Start: 2013-10-22 — End: 2013-10-22

## 2013-10-22 MED ORDER — AMOXICILLIN 500 MG PO CAPS
1000.0000 mg | ORAL_CAPSULE | Freq: Two times a day (BID) | ORAL | Status: DC
  Administered 2013-10-22: 1000 mg via ORAL
  Filled 2013-10-22 (×2): qty 2

## 2013-10-22 NOTE — Progress Notes (Signed)
DC home with daughter, DC instructions verbally understood by daughter, no questions ask.

## 2013-10-22 NOTE — Progress Notes (Addendum)
Addendum (1015): Antibiotics narrowed  to amoxicillin. Will adjust amoxicillin dose to  1gm q12h with current renal function.  ANTIBIOTIC CONSULT NOTE - Follow-up  Pharmacy Consult for Vancomycin and Cefepime  Indication: pneumonia  Allergies  Allergen Reactions  . Tessalon [Benzonatate]     Hallucinations     Patient Measurements: Height: 5\' 2"  (157.5 cm) Weight: 90 lb (40.824 kg) IBW/kg (Calculated) : 50.1 Adjusted Body Weight:    Vital Signs: Temp: 98.3 F (36.8 C) (01/07 0609) Temp src: Oral (01/07 0609) BP: 128/58 mmHg (01/07 0609) Pulse Rate: 84 (01/07 0609) Intake/Output from previous day: 01/06 0701 - 01/07 0700 In: -  Out: 250 [Urine:250] Intake/Output from this shift:    Labs:  Recent Labs  10/19/13 1223 10/20/13 0625 10/22/13 0525  WBC 15.5* 15.5* 10.7*  HGB 9.7* 9.0* 9.4*  PLT 478* 444* 548*  CREATININE 1.42* 1.18* 0.88   Estimated Creatinine Clearance: 27.9 ml/min (by C-G formula based on Cr of 0.88). No results found for this basename: VANCOTROUGH, Corlis Leak, VANCORANDOM, GENTTROUGH, GENTPEAK, GENTRANDOM, TOBRATROUGH, TOBRAPEAK, TOBRARND, AMIKACINPEAK, AMIKACINTROU, AMIKACIN,  in the last 72 hours   Microbiology: Recent Results (from the past 720 hour(s))  URINE CULTURE     Status: None   Collection Time    09/22/13 12:05 PM      Result Value Range Status   Specimen Description URINE, RANDOM   Final   Special Requests Normal   Final   Culture  Setup Time     Final   Value: 09/22/2013 16:50     Performed at [place]     Final   Value: 80,000 COLONIES/ML     Performed at [facility]   Culture     Final   Value: Multiple bacterial morphotypes present, none predominant. Suggest appropriate recollection if clinically indicated.     Performed at [facility]   Report Status 09/23/2013 FINAL   Final  URINE CULTURE     Status: None   Collection Time    10/19/13  2:10 PM      Result Value Range Status    Specimen Description URINE, CATHETERIZED   Final   Special Requests NONE   Final   Culture  Setup Time     Final   Value: 10/20/2013 03:11     Performed at [place]     Final   Value: NO GROWTH     Performed at [facility]   Culture     Final   Value: NO GROWTH     Performed at [facility]   Report Status 10/20/2013 FINAL   Final  CULTURE, BLOOD (ROUTINE X 2)     Status: None   Collection Time    10/19/13  7:52 PM      Result Value Range Status   Specimen Description BLOOD LEFT ARM   Final   Special Requests BOTTLES DRAWN AEROBIC ONLY 10CC   Final   Culture  Setup Time     Final   Value: 10/20/2013 01:08     Performed at [facility]   Culture     Final   Value:        BLOOD CULTURE RECEIVED NO GROWTH TO DATE CULTURE WILL BE HELD FOR 5 DAYS BEFORE ISSUING A FINAL NEGATIVE REPORT     Performed at [facility]   Report Status PENDING   Incomplete  CULTURE, BLOOD (ROUTINE X  2)     Status: None   Collection Time    10/19/13  8:00 PM      Result Value Range Status   Specimen Description BLOOD RIGHT HAND   Final   Special Requests BOTTLES DRAWN AEROBIC ONLY 10CC   Final   Culture  Setup Time     Final   Value: 10/20/2013 01:08     Performed at [facility]   Culture     Final   Value:        BLOOD CULTURE RECEIVED NO GROWTH TO DATE CULTURE WILL BE HELD FOR 5 DAYS BEFORE ISSUING A FINAL NEGATIVE REPORT     Performed at [facility]   Report Status PENDING   Incomplete  CULTURE, EXPECTORATED SPUTUM-ASSESSMENT     Status: None   Collection Time    10/20/13  1:00 PM      Result Value Range Status   Specimen Description SPUTUM   Final   Special Requests NONE   Final   Sputum evaluation     Final   Value: THIS SPECIMEN IS ACCEPTABLE. RESPIRATORY CULTURE REPORT TO FOLLOW.   Report Status 10/20/2013 FINAL   Final  CULTURE, RESPIRATORY (NON-EXPECTORATED)     Status: None   Collection Time    10/20/13   1:00 PM      Result Value Range Status   Specimen Description SPUTUM   Final   Special Requests NONE   Final   Gram Stain     Final   Value: ABUNDANT WBC PRESENT,BOTH PMN AND MONONUCLEAR     FEW SQUAMOUS EPITHELIAL CELLS PRESENT     ABUNDANT GRAM POSITIVE RODS     RARE GRAM POSITIVE COCCI     IN PAIRS   Culture     Final   Value: NORMAL OROPHARYNGEAL FLORA     Performed at [facility]   Report Status 10/22/2013 FINAL   Final    Assessment: 80 y/o female on Vancomycin and Cefepime Day#4/8 for HCAP. Strep pneumo urinary antigen positive. WBC 10.7 -trending down. Scr down to nl, est CrCl 28 ml/min.  Vanc 1/4>>1/12 Cefepime 1/4>>1/12  1/5 sputum>>neg 1/4 Bld x 2>>ngtd 1/4 Urine>>neg   Goal of Therapy:  Vancomycin trough level 15-20 mcg/ml  Plan:  1) Change Vancomycin to 500mg  IV q24h (ends 1/12). 2) Continue Cefepime 500mg  IV q24h (ends 1/12). 3) F/u renal fxn, micro data, vanc trough at new Css if continues 4) Consider narrow abx to ceftriaxone with strep pnemo positive  Sherlon Handing, PharmD, BCPS Clinical pharmacist, pager [phone] 10/22/2013,9:31 AM

## 2013-10-22 NOTE — Care Management Note (Signed)
  Page 1 of 1   10/22/2013     10:35:37 AM   CARE MANAGEMENT NOTE 10/22/2013  Patient:  Theresa Rodriguez, Theresa Rodriguez   Account Number:  [id number]  Date Initiated:  10/22/2013  Documentation initiated by:  Magdalen Spatz  Subjective/Objective Assessment:     Action/Plan:   Anticipated DC Date:  10/22/2013   Anticipated DC Plan:  [place]         Choice offered to / List presented to:  C-4 Adult Children        HH arranged  HH-1 RN  [place].   Status of service:  Completed, signed off Medicare Important Message given?   (If response is "NO", the following Medicare IM given date fields will be blank) Date Medicare IM given:   Date Additional Medicare IM given:    Discharge Disposition:  [place]  Per UR Regulation:    If discussed at Long Length of Stay Meetings, dates discussed:    Comments:  10-22-13 Spoke to patient and patient's daughter Theresa Rodriguez [phone] at bedside. Patient will be discharging to daughter Theresa Rodriguez 's home : [place] , [place] . Magdalen Spatz RN BSN [phone]

## 2013-10-22 NOTE — Discharge Summary (Signed)
Physician Discharge Summary  Patient ID: Theresa Rodriguez MRN: [id number] DOB/AGE: 78/02/1922 78 y.o.  Admit date: 10/19/2013 Discharge date: 10/22/2013  Primary Care Physician:  Theresa Kinnier, DO  Discharge Diagnoses:   . Healthcare-associated pneumonia . UTI seems to have resolved  . protein calorie malnutrition Alzheimer's dementia   Consults: None   Recommendations for Outpatient Follow-up:  Home health PT, home health aid, RN have been arranged Please obtain chest x-ray in 2-3 weeks for complete resolution of the pneumonia  Allergies:   Allergies  Allergen Reactions  . Tessalon [Benzonatate]     Hallucinations      Discharge Medications:   Medication List    STOP taking these medications       levofloxacin 500 MG tablet  Commonly known as:  LEVAQUIN      TAKE these medications       acetaminophen 500 MG tablet  Commonly known as:  TYLENOL  Take 500-1,000 mg by mouth every 8 (eight) hours as needed for mild pain.     ALPRAZolam 0.25 MG tablet  Commonly known as:  XANAX  Take 1 tablet (0.25 mg total) by mouth 2 (two) times daily as needed for anxiety.     amoxicillin 500 MG capsule  Commonly known as:  AMOXIL  Take 2 capsules (1,000 mg total) by mouth 3 (three) times daily. X 5 more days     diclofenac sodium 1 % Gel  Commonly known as:  VOLTAREN  4 g 4 (four) times daily.     donepezil 5 MG tablet  Commonly known as:  ARICEPT  Take 5 mg by mouth 2 (two) times daily.     guaiFENesin 600 MG 12 hr tablet  Commonly known as:  MUCINEX  Take 1 tablet (600 mg total) by mouth 2 (two) times daily.     guaiFENesin-dextromethorphan 100-10 MG/5ML syrup  Commonly known as:  ROBITUSSIN DM  Take 5 mLs by mouth every 4 (four) hours as needed for cough (available OTC).     meloxicam 15 MG tablet  Commonly known as:  MOBIC  Take 15 mg by mouth daily as needed for pain.     VITAMIN D3 ADULT GUMMIES PO  Take 2 tablets by mouth daily.         Brief H and  P: For complete details please refer to admission H and P, but in briefTheresa Rodriguez is a 80 y.o. female, who lives at home with her daughter but was recently admitted last month to the hospital for dysphagia presenting with 2 days history of cough and shortness of breath. The patient complained of having yellowish sputum production. The patient was treated 1 week before for UTI with Cipro later on switched to Zithromax because of possible bronchitis subsequently a day before the admission, changed to Levaquin to cover UTI and bronchitis.  Patient denies chest pains but reports shortness of breath. Maximum temperature in the emergency room was 99.4. Patient has history of dysphagia but nonspecific.   Hospital Course:   Right-sided pneumonia/ HCAP/pneumococcal pneumonia: patient was in the hospital last month, improving. Patient was placed on IV vancomycin and cefepime, nebulizer treatments. Urine legionella antigen was negative however urine strep antigen was positive.   Swallow evaluation was done and no dysphagia/aspiration issues  were identified. Patient was [place] on amoxicillin for one week to complete the course. Home health PT, RN, home health aid was arranged. 2. Urinary tract infection Seems to have resolved  3. Sick sinus syndrome Status post pacemaker  placement  4. Dysphagia and protein calorie malnutrition  - Currently on regular diet, evaluated by swallow study/speech therapist  5. History of breast cancer status post right breast lumpectomy  6. Alzheimer's dementia  -Continue Aricept, Xanax    Day of Discharge BP 128/58  Pulse 84  Temp(Src) 98.3 F (36.8 C) (Oral)  Resp 20  Ht 5\' 2"  (1.575 m)  Wt 40.824 kg (90 lb)  BMI 16.46 kg/m2  SpO2 97%  Physical Exam: General: Alert and awake oriented x3 not in any acute distress decondition and frail  CVS: S1-S2 clear no murmur rubs or gallops Chest: clear to auscultation bilaterally, no wheezing rales or rhonchi Abdomen: soft  nontender, nondistended, normal bowel sounds Extremities: no cyanosis, clubbing or edema noted bilaterally    The results of significant diagnostics from this hospitalization (including imaging, microbiology, ancillary and laboratory) are listed below for reference.    LAB RESULTS: Basic Metabolic Panel:  Recent Labs Lab 10/20/13 0625 10/22/13 0525  NA 133* 135*  K 4.1 4.2  CL 96 99  CO2 22 23  GLUCOSE 99 88  BUN 26* 12  CREATININE 1.18* 0.88  CALCIUM 8.2* 8.6   Liver Function Tests: No results found for this basename: AST, ALT, ALKPHOS, BILITOT, PROT, ALBUMIN,  in the last 168 hours No results found for this basename: LIPASE, AMYLASE,  in the last 168 hours No results found for this basename: AMMONIA,  in the last 168 hours CBC:  Recent Labs Lab 10/19/13 1223 10/20/13 0625 10/22/13 0525  WBC 15.5* 15.5* 10.7*  NEUTROABS 13.6*  --   --   HGB 9.7* 9.0* 9.4*  HCT 29.5* 27.6* 28.2*  MCV 85.8 85.2 86.0  PLT 478* 444* 548*   Cardiac Enzymes: No results found for this basename: CKTOTAL, CKMB, CKMBINDEX, TROPONINI,  in the last 168 hours BNP: No components found with this basename: POCBNP,  CBG: No results found for this basename: GLUCAP,  in the last 168 hours  Significant Diagnostic Studies:  Dg Chest 2 View  10/19/2013   CLINICAL DATA:  Cough and congestion, altered mental status  EXAM: CHEST  2 VIEW  COMPARISON:  No comparisons  FINDINGS: Normal cardiac silhouette. There is right lower lobe airspace disease. Lungs are hyperinflated. No pneumothorax. Small amount airspace disease also noted in the right upper lobe superior to the horizontal fissure.  IMPRESSION: Right lower lobe pneumonia with mild involvement of the right upper lobe additionally.   Electronically Signed   By: Theresa Rodriguez M.D.   On: 10/19/2013 14:48      Disposition and Follow-up:     Discharge Orders   Future Orders Complete By Expires   Diet - low sodium heart healthy  As directed     Increase activity slowly  As directed        DISPOSITION:  home  DIET: Heart healthy diet   DISCHARGE FOLLOW-UP Follow-up Information   Follow up with REED, TIFFANY, DO. Schedule an appointment as soon as possible for a visit in 10 days. (for hospital follow-up)    Specialty:  Geriatric Medicine   Contact information:   [address] [place] [address] [phone]       Time spent on Discharge: 35 minutes   Signed:   RAI,RIPUDEEP M.D. [facility] 10/22/2013, 10:11 AM Pager: [phone]

## 2013-10-26 LAB — CULTURE, BLOOD (ROUTINE X 2)
Culture: NO GROWTH
Culture: NO GROWTH

## 2013-10-28 ENCOUNTER — Encounter: Payer: Self-pay | Admitting: Internal Medicine

## 2013-10-28 ENCOUNTER — Ambulatory Visit (INDEPENDENT_AMBULATORY_CARE_PROVIDER_SITE_OTHER): Payer: Medicare Other | Admitting: Internal Medicine

## 2013-10-28 VITALS — BP 108/66 | HR 90 | Resp 10 | Wt 88.8 lb

## 2013-10-28 DIAGNOSIS — M169 Osteoarthritis of hip, unspecified: Secondary | ICD-10-CM

## 2013-10-28 DIAGNOSIS — R531 Weakness: Secondary | ICD-10-CM

## 2013-10-28 DIAGNOSIS — F419 Anxiety disorder, unspecified: Secondary | ICD-10-CM

## 2013-10-28 DIAGNOSIS — F32A Depression, unspecified: Secondary | ICD-10-CM

## 2013-10-28 DIAGNOSIS — F0280 Dementia in other diseases classified elsewhere without behavioral disturbance: Secondary | ICD-10-CM

## 2013-10-28 DIAGNOSIS — R1310 Dysphagia, unspecified: Secondary | ICD-10-CM

## 2013-10-28 DIAGNOSIS — M1610 Unilateral primary osteoarthritis, unspecified hip: Secondary | ICD-10-CM

## 2013-10-28 DIAGNOSIS — J189 Pneumonia, unspecified organism: Secondary | ICD-10-CM

## 2013-10-28 DIAGNOSIS — G309 Alzheimer's disease, unspecified: Secondary | ICD-10-CM

## 2013-10-28 DIAGNOSIS — F329 Major depressive disorder, single episode, unspecified: Secondary | ICD-10-CM

## 2013-10-28 DIAGNOSIS — R5381 Other malaise: Secondary | ICD-10-CM

## 2013-10-28 DIAGNOSIS — F341 Dysthymic disorder: Secondary | ICD-10-CM

## 2013-10-28 DIAGNOSIS — R5383 Other fatigue: Secondary | ICD-10-CM

## 2013-10-28 MED ORDER — DICLOFENAC SODIUM 1 % TD GEL: 4.0000 g | Freq: Four times a day (QID) | TRANSDERMAL | Status: DC

## 2013-10-28 MED ORDER — ALPRAZOLAM 0.25 MG PO TABS: 0.2500 mg | ORAL_TABLET | Freq: Two times a day (BID) | ORAL | Status: DC | PRN

## 2013-10-28 MED ORDER — MELOXICAM 15 MG PO TABS: 15.0000 mg | ORAL_TABLET | Freq: Every day | ORAL | Status: DC | PRN

## 2013-10-28 NOTE — Progress Notes (Signed)
Patient ID: Theresa Rodriguez, female   DOB: 1921/10/30, 80 y.o.   MRN: [id number]      PCP: Hollace Kinnier, DO   Allergies  Allergen Reactions  . Tessalon [Benzonatate]     Hallucinations    Code status: DNR  Chief Complaint: hospital follow up  HPI:  80 y/o female patient is here for follow up visit after hospitalization from 10/19/13- 10/22/13 with HCAP and UTI. She has protein calorie malnutrition and alzhimer's dementia. She completed her antibiotics yesterday. No fever or chills. Appetite has improved. Has PT and SLP coming in to her house.  Tolerating her feed fine. Needs refills on her medications  Review of Systems:  Constitutional: Negative for fever, chills, weight loss, malaise/fatigue and diaphoresis.  HENT: Negative for congestion, hearing loss and sore throat.   Eyes: Negative for blurred vision, double vision and discharge.  Respiratory: Negative for sputum production, shortness of breath and wheezing.  has cough mostly dry. Has completed his  Cardiovascular: Negative for chest pain, palpitations, orthopnea and leg swelling.  Gastrointestinal: Negative for heartburn, nausea, vomiting, abdominal pain, diarrhea and constipation.  Genitourinary: Negative for dysuria, urgency, frequency and flank pain.  Musculoskeletal: Negative for back pain, falls, joint pain and myalgias.  Skin: Negative for itching and rash.  Neurological:  Negative for dizziness, tingling, focal weakness and headaches. strength has been improving as per her daughters Psychiatric/Behavioral: Negative for depression.The patient is not nervous/anxious.    Past Medical History  Diagnosis Date  . Vitamin D deficiency   . Insomnia, unspecified   . Other specified cardiac dysrhythmias(427.89)   . Nausea with vomiting   . Dysphagia, oral phase   . Loss of weight   . Hyperpotassemia   . Dizziness and giddiness   . Chronic lymphocytic thyroiditis   . Other and unspecified hyperlipidemia   .  Hypothyroidism   . Unspecified protein-calorie malnutrition   . Osteoarthritis, knee   . Senile osteoporosis   . Anxiety and depression   . Breast cancer     "right" (09/22/2013)  . Benign hypertension     "took off BP meds this fall" (09/22/2013)  . Alzheimer's disease     "probably moderate" (09/22/2013)  . Anxiety   . Seizures     "might have had one today; not sure" (09/22/2013   Past Surgical History  Procedure Laterality Date  . Eye surgery    . Knee surgery Right 1940's    "cyst on her knee" (09/22/2013)  . Breast lumpectomy Right 1995   Social History:   reports that she has quit smoking. Her smoking use included Cigarettes. She has a 20 pack-year smoking history. She has never used smokeless tobacco. She reports that she drinks about 4.2 ounces of alcohol per week. She reports that she does not use illicit drugs.  Family History  Problem Relation Age of Onset  . Pneumonia Mother   . Heart attack Father   . Pneumonia Brother     Medications: Patient's Medications  New Prescriptions   No medications on file  Previous Medications   ACETAMINOPHEN (TYLENOL) 500 MG TABLET    Take 500-1,000 mg by mouth every 8 (eight) hours as needed for mild pain.    ALPRAZOLAM (XANAX) 0.25 MG TABLET    Take 1 tablet (0.25 mg total) by mouth 2 (two) times daily as needed for anxiety.   CHOLECALCIFEROL (VITAMIN D3 ADULT GUMMIES PO)    Take 2 tablets by mouth daily.    DICLOFENAC SODIUM (VOLTAREN) 1 %  GEL    4 g 4 (four) times daily.    DONEPEZIL (ARICEPT) 5 MG TABLET    Take 5 mg by mouth 2 (two) times daily.   MELOXICAM (MOBIC) 15 MG TABLET    Take 15 mg by mouth daily as needed for pain.  Modified Medications   No medications on file  Discontinued Medications   AMOXICILLIN (AMOXIL) 500 MG CAPSULE    Take 2 capsules (1,000 mg total) by mouth 3 (three) times daily. X 5 more days   GUAIFENESIN (MUCINEX) 600 MG 12 HR TABLET    Take 1 tablet (600 mg total) by mouth 2 (two) times daily.    GUAIFENESIN-DEXTROMETHORPHAN (ROBITUSSIN DM) 100-10 MG/5ML SYRUP    Take 5 mLs by mouth every 4 (four) hours as needed for cough (available OTC).     Physical Exam:  Filed Vitals:   10/28/13 0954  BP: 108/66  Pulse: 90  Resp: 10  Weight: 88 lb 12.8 oz (40.279 kg)  SpO2: 98%   General- elderly female in no acute distress Head- atraumatic, normocephalic Eyes- PERRLA, EOMI, no pallor, no icterus, no discharge Neck- no lymphadenopathy, no thyromegaly, no jugular vein distension, no carotid bruit Cardiovascular- normal s1,s2, no murmurs/ rubs/ gallops Respiratory- bilateral clear to auscultation, occassional wheeze, no rhonchi, no crackles Abdomen- bowel sounds present, soft, non tender Musculoskeletal- able to move all 4 extremities, steady gait, no use of assistive device Neurological- no focal deficit Psychiatry- alert and oriented only to person  Labs reviewed: Basic Metabolic Panel:  Recent Labs  02/27/13 1214  10/19/13 1223 10/20/13 0625 10/22/13 0525  NA 138  < > 133* 133* 135*  K 4.1  < > 3.9 4.1 4.2  CL 93*  < > 92* 96 99  CO2 27  < > 28 22 23   GLUCOSE 85  < > 90 99 88  BUN 36*  < > 41* 26* 12  CREATININE 1.13*  < > 1.42* 1.18* 0.88  CALCIUM 10.1  < > 9.0 8.2* 8.6  MG 2.0  --   --   --   --   < > = values in this interval not displayed. Liver Function Tests:  Recent Labs  02/27/13 1214 06/25/13 0924 09/23/13 0530  AST 29 26 15   ALT 19 15 11   ALKPHOS 78 70 61  BILITOT 0.4 0.4 0.5  PROT 7.6 7.1 6.4  ALBUMIN  --   --  2.8*   No results found for this basename: LIPASE, AMYLASE,  in the last 8760 hours No results found for this basename: AMMONIA,  in the last 8760 hours CBC:  Recent Labs  09/22/13 1125  10/19/13 1223 10/20/13 0625 10/22/13 0525  WBC 9.2  < > 15.5* 15.5* 10.7*  NEUTROABS 7.6  --  13.6*  --   --   HGB 11.8*  < > 9.7* 9.0* 9.4*  HCT 36.7  < > 29.5* 27.6* 28.2*  MCV 89.3  < > 85.8 85.2 86.0  PLT 232  < > 478* 444* 548*  < > =  values in this interval not displayed. Cardiac Enzymes:  Recent Labs  09/22/13 1825 09/22/13 2350 09/23/13 0530  TROPONINI <0.30 <0.30 <0.30   Radiological Exams: Dg Chest 2 View  10/19/2013   CLINICAL DATA:  Cough and congestion, altered mental status  EXAM: CHEST  2 VIEW  COMPARISON:  No comparisons  FINDINGS: Normal cardiac silhouette. There is right lower lobe airspace disease. Lungs are hyperinflated. No pneumothorax. Small amount airspace disease  also noted in the right upper lobe superior to the horizontal fissure.  IMPRESSION: Right lower lobe pneumonia with mild involvement of the right upper lobe additionally.   Electronically Signed   By: Suzy Bouchard M.D.   On: 10/19/2013 14:48   Assessment/Plan  1. Anxiety and depression Stable. Continue her xanax bid prn - ALPRAZolam (XANAX) 0.25 MG tablet; Take 1 tablet (0.25 mg total) by mouth 2 (two) times daily as needed for anxiety.  Dispense: 20 tablet; Refill: 0  2. HCAP (healthcare-associated pneumonia) Completed her antibiotics. Continue her cough expectorant. Repeat cxr in 2 weeks to assess for resolution. Aspiration precautions - DG Chest 2 View; Future  3. Dysphagia To work with SLP team, family mentions providing regular consistency diet at present. Aspiration precautions to be taken and follow SLP recs  4. Alzheimer's disease Continue aricept and monitor clinically  5. OA (osteoarthritis) of hip Continue voltaren gel and meloxicam. Refills provided  6. Weakness generalized To work with physical therapy and have muscle strengthening. Fall precautions

## 2013-11-07 ENCOUNTER — Telehealth: Payer: Self-pay | Admitting: *Deleted

## 2013-11-07 NOTE — Telephone Encounter (Signed)
Daughter called and stated that the [facility] nurse called and stated that Dr. Mariea Clonts needed to sign order for a 3 n 1 Commode and seat. Spoke with Dr. Mariea Clonts and she has already signed and faxed back. Called daughter to inform and she stated that it has been taken care of.

## 2013-11-19 ENCOUNTER — Other Ambulatory Visit: Payer: Self-pay | Admitting: Nurse Practitioner

## 2013-11-23 ENCOUNTER — Other Ambulatory Visit: Payer: Self-pay | Admitting: Nurse Practitioner

## 2013-12-08 ENCOUNTER — Other Ambulatory Visit: Payer: Self-pay | Admitting: Internal Medicine

## 2013-12-19 ENCOUNTER — Telehealth: Payer: Self-pay | Admitting: *Deleted

## 2013-12-19 NOTE — Telephone Encounter (Signed)
Patient has been crying and Alzheimer's worse. (Talking to patient's daughter Hilda Blades and she gets another call, and states she can't miss the call and hangs up).

## 2013-12-23 ENCOUNTER — Ambulatory Visit (INDEPENDENT_AMBULATORY_CARE_PROVIDER_SITE_OTHER): Payer: Medicare Other | Admitting: Internal Medicine

## 2013-12-23 ENCOUNTER — Encounter: Payer: Self-pay | Admitting: Internal Medicine

## 2013-12-23 VITALS — BP 128/80 | HR 55 | Resp 10 | Wt 91.0 lb

## 2013-12-23 DIAGNOSIS — F329 Major depressive disorder, single episode, unspecified: Secondary | ICD-10-CM

## 2013-12-23 DIAGNOSIS — F0280 Dementia in other diseases classified elsewhere without behavioral disturbance: Secondary | ICD-10-CM

## 2013-12-23 DIAGNOSIS — F419 Anxiety disorder, unspecified: Secondary | ICD-10-CM

## 2013-12-23 DIAGNOSIS — M1610 Unilateral primary osteoarthritis, unspecified hip: Secondary | ICD-10-CM

## 2013-12-23 DIAGNOSIS — F02818 Dementia in other diseases classified elsewhere, unspecified severity, with other behavioral disturbance: Secondary | ICD-10-CM

## 2013-12-23 DIAGNOSIS — F341 Dysthymic disorder: Secondary | ICD-10-CM

## 2013-12-23 DIAGNOSIS — G309 Alzheimer's disease, unspecified: Principal | ICD-10-CM | POA: Insufficient documentation

## 2013-12-23 DIAGNOSIS — M169 Osteoarthritis of hip, unspecified: Secondary | ICD-10-CM

## 2013-12-23 DIAGNOSIS — F0281 Dementia in other diseases classified elsewhere with behavioral disturbance: Secondary | ICD-10-CM | POA: Insufficient documentation

## 2013-12-23 MED ORDER — MEMANTINE HCL ER 14 MG PO CP24: 1.0000 | ORAL_CAPSULE | Freq: Every day | ORAL | Status: DC

## 2013-12-23 MED ORDER — MEMANTINE HCL ER 7 MG PO CP24: 1.0000 | ORAL_CAPSULE | Freq: Every morning | ORAL | Status: DC

## 2013-12-23 MED ORDER — ALPRAZOLAM 0.5 MG PO TABS
0.5000 mg | ORAL_TABLET | Freq: Two times a day (BID) | ORAL | Status: DC
Start: 2013-12-23 — End: 2014-01-12

## 2013-12-23 MED ORDER — QUETIAPINE FUMARATE 25 MG PO TABS: 12.5000 mg | ORAL_TABLET | Freq: Every day | ORAL | Status: DC

## 2013-12-23 NOTE — Progress Notes (Signed)
Patient ID: Theresa Rodriguez, female   DOB: [birth year], 80 y.o.   MRN: [id number]    Chief Complaint  Patient presents with  . Memory Loss    worsening memory, difficult to provide care at home  . Anxiety    worsening anxiety   Allergies  Allergen Reactions  . Tessalon [Benzonatate]     Hallucinations    HPI 80 y/o female patient is here for acute visit. She has dementia and her daughters are present this visit. She is anxious about her visit. She mentions feeling fine and would like to know why they brought her to the doctor's office. Her dementia has worsened as per the daughter and she would like to consider restarting namenda. She is taking her aricept. Her anxiety had worsened and daughter increased her xanax from 0.25 to 0.5 mg bid. She mentions this has helped her some but has episodes of confusion and behavior change present.    Review of Systems:  Constitutional: Negative for fever, chills, weight loss, malaise/fatigue and diaphoresis.  HENT: Negative for congestion, hearing loss and sore throat.   Eyes: Negative for blurred vision, double vision and discharge.  Respiratory: Negative for sputum production, shortness of breath and wheezing.   Cardiovascular: Negative for chest pain, palpitations, orthopnea and leg swelling.  Gastrointestinal: Negative for heartburn, nausea, vomiting, abdominal pain, diarrhea and constipation.  Genitourinary: Negative for dysuria, urgency, frequency and flank pain.  Musculoskeletal: Negative for back pain, falls, myalgias.  Skin: Negative for itching and rash.  Neurological:  Negative for dizziness, tingling, focal weakness and headaches.  Psychiatric/Behavioral: anxiety present, restlessness present.    Past Medical History  Diagnosis Date  . Vitamin D deficiency   . Insomnia, unspecified   . Other specified cardiac dysrhythmias(427.89)   . Nausea with vomiting   . Dysphagia, oral phase   . Loss of weight   . Hyperpotassemia   .  Dizziness and giddiness   . Chronic lymphocytic thyroiditis   . Other and unspecified hyperlipidemia   . Hypothyroidism   . Unspecified protein-calorie malnutrition   . Osteoarthritis, knee   . Senile osteoporosis   . Anxiety and depression   . Breast cancer     "right" (09/22/2013)  . Benign hypertension     "took off BP meds this fall" (09/22/2013)  . Alzheimer's disease     "probably moderate" (09/22/2013)  . Anxiety   . Seizures     "might have had one today; not sure" (09/22/2013   Past Surgical History  Procedure Laterality Date  . Eye surgery    . Knee surgery Right 1940's    "cyst on her knee" (09/22/2013)  . Breast lumpectomy Right 1995    Current Outpatient Prescriptions on File Prior to Visit  Medication Sig Dispense Refill  . acetaminophen (TYLENOL) 500 MG tablet Take 500-1,000 mg by mouth every 8 (eight) hours as needed for mild pain.       . Cholecalciferol (VITAMIN D3 ADULT GUMMIES PO) Take 2 tablets by mouth daily.       . diclofenac sodium (VOLTAREN) 1 % GEL Apply 4 g topically 4 (four) times daily.  1 Tube  2  . meloxicam (MOBIC) 15 MG tablet Take 1 tablet (15 mg total) by mouth daily as needed for pain.  30 tablet  3   No current facility-administered medications on file prior to visit.    Physical exam BP 128/80  Pulse 55  Resp 10  Wt 91 lb (41.277 kg)  SpO2 87%  General- elderly female in no acute distress, thin built Head- atraumatic, normocephalic Eyes- PERRLA, EOMI, no pallor, no icterus, no discharge Neck- no lymphadenopathy, no thyromegaly, no jugular vein distension, no carotid bruit Cardiovascular- normal s1,s2, no murmurs/ rubs/ gallops Respiratory- bilateral clear to auscultation, occassional wheeze, no rhonchi, no crackles Abdomen- bowel sounds present, soft, non tender Musculoskeletal- able to move all 4 extremities, steady gait, no use of assistive device Neurological- no focal deficit Psychiatry- alert and oriented only to person,  anxious, able to carry out conversation  Labs- CBC    Component Value Date/Time   WBC 10.7* 10/22/2013 0525   RBC 3.28* 10/22/2013 0525   HGB 9.4* 10/22/2013 0525   HCT 28.2* 10/22/2013 0525   PLT 548* 10/22/2013 0525   MCV 86.0 10/22/2013 0525   MCH 28.7 10/22/2013 0525   MCHC 33.3 10/22/2013 0525   RDW 13.8 10/22/2013 0525   LYMPHSABS 1.3 10/19/2013 1223   MONOABS 0.6 10/19/2013 1223   EOSABS 0.0 10/19/2013 1223   BASOSABS 0.0 10/19/2013 1223    CMP     Component Value Date/Time   NA 135* 10/22/2013 0525   NA 142 06/25/2013 0924   K 4.2 10/22/2013 0525   CL 99 10/22/2013 0525   CO2 23 10/22/2013 0525   GLUCOSE 88 10/22/2013 0525   GLUCOSE 92 06/25/2013 0924   BUN 12 10/22/2013 0525   BUN 40* 06/25/2013 0924   CREATININE 0.88 10/22/2013 0525   CALCIUM 8.6 10/22/2013 0525   PROT 6.4 09/23/2013 0530   PROT 7.1 06/25/2013 0924   ALBUMIN 2.8* 09/23/2013 0530   AST 15 09/23/2013 0530   ALT 11 09/23/2013 0530   ALKPHOS 61 09/23/2013 0530   BILITOT 0.5 09/23/2013 0530   GFRNONAA 57* 10/22/2013 0525   GFRAA 66* 10/22/2013 0525    Assessment/plan  1. Alzheimer's dementia with behavioral disturbance Worsened. Explained to family to anticipate decline. Talked about benefit and side effect of namenda. Will start on namenda xr 7 mg daily for a week and then upto 14 mg daily. Continue aricept 5 mg daily. Discussed about long term care plan options. Daughters are looking into SNF at present. They want to see if the medication will help her some and decide further  2. Anxiety and depression Will have her started on seroquel 12.5 mg daily for now at bedtime. This should help with delirium and anxiety. Xanax increased to 0.5 mg bid for now until further reassessment. If she tolerates seroquel fine, will consider decreasing dose of xanax  3. OA (osteoarthritis) of hip Continue mobic for now

## 2014-01-12 ENCOUNTER — Encounter: Payer: Self-pay | Admitting: Internal Medicine

## 2014-01-12 ENCOUNTER — Ambulatory Visit (INDEPENDENT_AMBULATORY_CARE_PROVIDER_SITE_OTHER): Payer: Medicare Other | Admitting: Internal Medicine

## 2014-01-12 VITALS — BP 148/80 | HR 81 | Temp 97.5°F | Wt 93.0 lb

## 2014-01-12 DIAGNOSIS — G309 Alzheimer's disease, unspecified: Secondary | ICD-10-CM

## 2014-01-12 DIAGNOSIS — M545 Low back pain, unspecified: Secondary | ICD-10-CM

## 2014-01-12 DIAGNOSIS — M549 Dorsalgia, unspecified: Secondary | ICD-10-CM | POA: Insufficient documentation

## 2014-01-12 DIAGNOSIS — I10 Essential (primary) hypertension: Secondary | ICD-10-CM | POA: Insufficient documentation

## 2014-01-12 DIAGNOSIS — F0280 Dementia in other diseases classified elsewhere without behavioral disturbance: Secondary | ICD-10-CM

## 2014-01-12 DIAGNOSIS — F410 Panic disorder [episodic paroxysmal anxiety] without agoraphobia: Secondary | ICD-10-CM

## 2014-01-12 DIAGNOSIS — F02818 Dementia in other diseases classified elsewhere, unspecified severity, with other behavioral disturbance: Secondary | ICD-10-CM

## 2014-01-12 DIAGNOSIS — F0281 Dementia in other diseases classified elsewhere with behavioral disturbance: Secondary | ICD-10-CM

## 2014-01-12 DIAGNOSIS — M1611 Unilateral primary osteoarthritis, right hip: Secondary | ICD-10-CM

## 2014-01-12 DIAGNOSIS — M1610 Unilateral primary osteoarthritis, unspecified hip: Secondary | ICD-10-CM

## 2014-01-12 DIAGNOSIS — E871 Hypo-osmolality and hyponatremia: Secondary | ICD-10-CM | POA: Insufficient documentation

## 2014-01-12 MED ORDER — ALPRAZOLAM 0.5 MG PO TABS: 0.5000 mg | ORAL_TABLET | Freq: Three times a day (TID) | ORAL | Status: DC | PRN

## 2014-01-12 NOTE — Progress Notes (Signed)
Patient ID: Theresa Rodriguez, female   DOB: 05/18/1922, 80 y.o.   MRN: [id number]   Location:  [facility]  Code Status: DNR  Allergies  Allergen Reactions  . Tessalon [Benzonatate]     Hallucinations     Chief Complaint  Patient presents with  . Anxiety    Patient here today with daughter to discuss anxiety and medications     HPI: Patient is a 80 y.o. female seen in the office today for medical mgt of chronic diseases including Alzheimer's disease, vit D deficiency, hypothyroidism, and anxiety.  She is accompanied by her two daughters.  She has not been taking her namenda regularly.  She says there is nothing wrong with her.    Had two hospitalizations:  Dec syncope, Jan HCAP.  C/o chest pain and anxiety.  Wants to go home and calls all of the time.  Was started on seroquel by Dr. Bubba Camp, but it doesn't seem to be helping.    Also c/o pain in her back.  Had fallen a month ago when her pants were down around her ankle.  Ribs on one side hurt.  May have caused back pain.    Woke up at 4am last night at her other daughter's house--was crying, anxious, wanted to go with family and of course they are her family.  Had a lot of pain in her back another evening in the morning.  Walking with stooped posture.    No hallucination with seroquel on board.  Is not taking the namenda anymore.  Zoloft caused hallucinations.    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  Needs her food cut up or handed to her.  Having some urinary incontinence now.  Needs reminders to go to the bathroom.    Review of Systems:  Review of Systems  Unable to perform ROS: dementia  was agitated and combative today and did not want to participate   Past Medical History  Diagnosis Date  . Vitamin D deficiency   . Insomnia, unspecified   . Other specified cardiac dysrhythmias(427.89)   . Nausea with vomiting   .  Dysphagia, oral phase   . Loss of weight   . Hyperpotassemia   . Dizziness and giddiness   . Chronic lymphocytic thyroiditis   . Other and unspecified hyperlipidemia   . Hypothyroidism   . Unspecified protein-calorie malnutrition   . Osteoarthritis, knee   . Senile osteoporosis   . Anxiety and depression   . Breast cancer     "right" (09/22/2013)  . Benign hypertension     "took off BP meds this fall" (09/22/2013)  . Alzheimer's disease     "probably moderate" (09/22/2013)  . Anxiety   . Seizures     "might have had one today; not sure" (09/22/2013    Past Surgical History  Procedure Laterality Date  . Eye surgery    . Knee surgery Right 1940's    "cyst on her knee" (09/22/2013)  . Breast lumpectomy Right 1995    Social History:   reports that she has quit smoking. Her smoking use included Cigarettes. She has a 20 pack-year smoking history. She has never used smokeless tobacco. She reports that she drinks about 4.2 ounces of alcohol per week. She reports that she does not use illicit drugs.  Family History  Problem Relation Age of Onset  . Pneumonia Mother   . Heart attack Father   .  Pneumonia Brother     Medications: Patient's Medications  New Prescriptions   No medications on file  Previous Medications   ACETAMINOPHEN (TYLENOL) 500 MG TABLET    Take 500-1,000 mg by mouth every 8 (eight) hours as needed for mild pain.    ALPRAZOLAM (XANAX) 0.5 MG TABLET    Take 1 tablet (0.5 mg total) by mouth 2 (two) times daily.   CHOLECALCIFEROL (VITAMIN D3 ADULT GUMMIES PO)    Take 2 tablets by mouth daily.    DICLOFENAC SODIUM (VOLTAREN) 1 % GEL    Apply 4 g topically 4 (four) times daily.   DONEPEZIL (ARICEPT) 5 MG TABLET    2 by mouth in the am   MELOXICAM (MOBIC) 15 MG TABLET    Take 1 tablet (15 mg total) by mouth daily as needed for pain.   MEMANTINE HCL ER (NAMENDA XR) 14 MG CP24    Take 1 tablet (14 mg total) by mouth daily.   MEMANTINE HCL ER (NAMENDA XR) 7 MG CP24    Take  1 capsule (7 mg total) by mouth every morning.   QUETIAPINE (SEROQUEL) 25 MG TABLET    Take 0.5 tablets (12.5 mg total) by mouth at bedtime.  Modified Medications   No medications on file  Discontinued Medications   No medications on file     Physical Exam: Filed Vitals:   01/12/14 1404  BP: 148/80  Pulse: 81  Temp: 97.5 F (36.4 C)  TempSrc: Oral  Weight: 93 lb (42.185 kg)  SpO2: 97%  Physical Exam  Constitutional:  Frail white female walks w/o assistive device  Cardiovascular: Normal rate, regular rhythm, normal heart sounds and intact distal pulses.   Pulmonary/Chest: Effort normal and breath sounds normal. No respiratory distress.  Abdominal: Soft. Bowel sounds are normal.  Musculoskeletal: Normal range of motion.  Neurological: She is alert.  Psychiatric:  agitated    Labs reviewed: Basic Metabolic Panel:  Recent Labs  02/27/13 1214 06/25/13 0924  09/22/13 1825  10/19/13 1223 10/20/13 0625 10/22/13 0525  NA 138 142  < >  --   < > 133* 133* 135*  K 4.1 3.7  < >  --   < > 3.9 4.1 4.2  CL 93* 95*  < >  --   < > 92* 96 99  CO2 27 30*  < >  --   < > 28 22 23   GLUCOSE 85 92  < >  --   < > 90 99 88  BUN 36* 40*  < >  --   < > 41* 26* 12  CREATININE 1.13* 1.30*  < >  --   < > 1.42* 1.18* 0.88  CALCIUM 10.1 9.7  < >  --   < > 9.0 8.2* 8.6  MG 2.0  --   --   --   --   --   --   --   TSH 3.650 4.210  --  3.070  --   --   --   --   < > = values in this interval not displayed. Liver Function Tests:  Recent Labs  02/27/13 1214 06/25/13 0924 09/23/13 0530  AST 29 26 15   ALT 19 15 11   ALKPHOS 78 70 61  BILITOT 0.4 0.4 0.5  PROT 7.6 7.1 6.4  ALBUMIN  --   --  2.8*   No results found for this basename: LIPASE, AMYLASE,  in the last 8760 hours No results found for this  basename: AMMONIA,  in the last 8760 hours CBC:  Recent Labs  09/22/13 1125  10/19/13 1223 10/20/13 0625 10/22/13 0525  WBC 9.2  < > 15.5* 15.5* 10.7*  NEUTROABS 7.6  --  13.6*  --   --     HGB 11.8*  < > 9.7* 9.0* 9.4*  HCT 36.7  < > 29.5* 27.6* 28.2*  MCV 89.3  < > 85.8 85.2 86.0  PLT 232  < > 478* 444* 548*  < > = values in this interval not displayed.  Assessment/Plan 1. Panic attacks -has been having these and benefits from xanax - ALPRAZolam (XANAX) 0.5 MG tablet; Take 1 tablet (0.5 mg total) by mouth 3 (three) times daily as needed for anxiety.  Dispense: 90 tablet; Refill: 3  2. Alzheimer's dementia with behavioral disturbance -is progressing, she is developing behaviors now, cont namenda XR and seroquel  3. Hyponatremia -f/u labs - Basic metabolic panel  4. Primary osteoarthritis of right hip -since fall, cont mobic, voltaren gel and tylenol  5. Midline low back pain without sciatica -since fall, cont mobic, voltaren gel and tylenol  6. Essential hypertension, benign -not requiring meds - Basic metabolic panel    Labs/tests ordered: Orders Placed This Encounter  Procedures  . Basic metabolic panel    Next appt:  1 month

## 2014-01-13 LAB — BASIC METABOLIC PANEL
BUN/Creatinine Ratio: 47 — ABNORMAL HIGH (ref 11–26)
BUN: 43 mg/dL — ABNORMAL HIGH (ref 8–27)
CO2: 22 mmol/L (ref 18–29)
Calcium: 9.1 mg/dL (ref 8.7–10.3)
Chloride: 100 mmol/L (ref 97–108)
Creatinine, Ser: 0.91 mg/dL (ref 0.57–1.00)
GFR calc Af Amer: 65 mL/min/{1.73_m2} (ref >59)
GFR calc non Af Amer: 56 mL/min/{1.73_m2} — ABNORMAL LOW (ref >59)
Glucose: 80 mg/dL (ref 65–99)
Potassium: 4.8 mmol/L (ref 3.5–5.2)
Sodium: 142 mmol/L (ref 134–144)

## 2014-01-21 ENCOUNTER — Ambulatory Visit (INDEPENDENT_AMBULATORY_CARE_PROVIDER_SITE_OTHER): Payer: Medicare Other | Admitting: Internal Medicine

## 2014-01-21 ENCOUNTER — Encounter: Payer: Self-pay | Admitting: Internal Medicine

## 2014-01-21 VITALS — BP 120/80 | HR 81 | Temp 98.6°F | Resp 20 | Ht 62.0 in | Wt 93.2 lb

## 2014-01-21 DIAGNOSIS — I10 Essential (primary) hypertension: Secondary | ICD-10-CM

## 2014-01-21 DIAGNOSIS — F02818 Dementia in other diseases classified elsewhere, unspecified severity, with other behavioral disturbance: Secondary | ICD-10-CM

## 2014-01-21 DIAGNOSIS — R1310 Dysphagia, unspecified: Secondary | ICD-10-CM

## 2014-01-21 DIAGNOSIS — E039 Hypothyroidism, unspecified: Secondary | ICD-10-CM

## 2014-01-21 DIAGNOSIS — G309 Alzheimer's disease, unspecified: Principal | ICD-10-CM

## 2014-01-21 DIAGNOSIS — F0280 Dementia in other diseases classified elsewhere without behavioral disturbance: Secondary | ICD-10-CM

## 2014-01-21 DIAGNOSIS — F0281 Dementia in other diseases classified elsewhere with behavioral disturbance: Secondary | ICD-10-CM

## 2014-01-21 NOTE — Progress Notes (Signed)
Patient ID: Theresa Rodriguez, female   DOB: 1921/11/16, 80 y.o.   MRN: [id number]    Location:    [facility]  Place of Service:  OFFICE  PCP: Hollace Kinnier, DO  Code Status: NCB, DNR, HCPOA, LIVING WILL  Extended Emergency Contact Information Primary Emergency Contact: Fedorovich,[name] Address: [place], [place] [employer] Phone: [phone] Relation: Daughter Secondary Emergency Contact: Picardi,Cathy  [place] Mobile Phone: [phone] Relation: Daughter  Allergies  Allergen Reactions  . Tessalon [Benzonatate]     Hallucinations     Chief Complaint  Patient presents with  . Annual Exam    for nursing home admission    HPI:  Alzheimer's dementia with behavioral disturbance: stable  Essential hypertension, benign;controlled  Dysphagia: mild. Has to crush medication and put in honey  Hypothyroidism: compensated      Past Medical History  Diagnosis Date  . Vitamin D deficiency   . Insomnia, unspecified   . Other specified cardiac dysrhythmias(427.89)   . Nausea with vomiting   . Dysphagia, oral phase   . Loss of weight   . Hyperpotassemia   . Dizziness and giddiness   . Chronic lymphocytic thyroiditis   . Other and unspecified hyperlipidemia   . Hypothyroidism   . Unspecified protein-calorie malnutrition   . Osteoarthritis, knee   . Senile osteoporosis   . Anxiety and depression   . Breast cancer     "right" (09/22/2013)  . Benign hypertension     "took off BP meds this fall" (09/22/2013)  . Alzheimer's disease     "probably moderate" (09/22/2013)  . Anxiety   . Seizures     "might have had one today; not sure" (09/22/2013    Past Surgical History  Procedure Laterality Date  . Eye surgery    . Knee surgery Right 1940's    "cyst on her knee" (09/22/2013)  . Breast lumpectomy Right 1995    [facility]: [place]    Social History: History   Social History  . Marital Status:  Widowed    Spouse Name: N/A    Number of Children: N/A  . Years of Education: N/A   Social History Main Topics  . Smoking status: Former Smoker -- 1.00 packs/day for 20 years    Types: Cigarettes  . Smokeless tobacco: Never Used     Comment: 09/22/2013 "quite smoking ~ 1960's"  . Alcohol Use: 4.2 oz/week    7 Cans of beer per week     Comment: 09/22/2013 "beer q hs"  . Drug Use: No  . Sexual Activity: No   Other Topics Concern  . None   Social History Narrative  . None    Family History Family Status  Relation Status Death Age  . Mother Deceased   . Father Deceased   . Sister Alive   . Brother Deceased   . Daughter Alive   . Daughter Alive   . Sister Alive   . Brother Alive   . Brother Deceased   . Brother Deceased    Family History  Problem Relation Age of Onset  . Pneumonia Mother   . Heart attack Father   . Pneumonia Brother      Medications: Patient's Medications  New Prescriptions   No medications on file  Previous Medications   ACETAMINOPHEN (TYLENOL) 500 MG TABLET    Take 500-1,000 mg by mouth every 8 (eight) hours as needed for mild  pain.    ALPRAZOLAM (XANAX) 0.5 MG TABLET    Take 1 tablet (0.5 mg total) by mouth 3 (three) times daily as needed for anxiety.   CHOLECALCIFEROL (VITAMIN D3 ADULT GUMMIES PO)    Take 2 tablets by mouth daily.    DICLOFENAC SODIUM (VOLTAREN) 1 % GEL    Apply 4 g topically 4 (four) times daily.   DONEPEZIL (ARICEPT) 5 MG TABLET    2 by mouth in the am   MELOXICAM (MOBIC) 15 MG TABLET    Take 1 tablet (15 mg total) by mouth daily as needed for pain.   QUETIAPINE (SEROQUEL) 25 MG TABLET    Take 0.5 tablets (12.5 mg total) by mouth at bedtime.  Modified Medications   No medications on file  Discontinued Medications   No medications on file    Immunization History  Administered Date(s) Administered  . Influenza-Unspecified 10/14/2012     Review of Systems  Constitutional:       Losing weight. Has to be reminded to  eat and drink. Elderly. Frail.  HENT: Positive for hearing loss. Negative for congestion.   Eyes: Negative.   Respiratory: Negative for cough, shortness of breath and wheezing.   Cardiovascular: Negative for chest pain, palpitations and leg swelling.  Gastrointestinal: Negative for abdominal pain, diarrhea and abdominal distention.  Endocrine: Negative.   Genitourinary: Negative for dysuria, frequency, decreased urine volume and pelvic pain.  Musculoskeletal: Positive for arthralgias. Negative for gait problem, myalgias and neck pain.  Skin: Negative.   Allergic/Immunologic: Negative.   Neurological:       Dementia  Hematological: Negative.   Psychiatric/Behavioral: Positive for sleep disturbance and dysphoric mood.      Filed Vitals:   01/21/14 1525  BP: 120/80  Pulse: 81  Temp: 98.6 F (37 C)  TempSrc: Oral  Resp: 20  Height: 5' 2"  (1.575 m)  Weight: 93 lb 3.2 oz (42.275 kg)  SpO2: 98%   Physical Exam  Constitutional:  THIN, FRAIL elderly, chronically ill  HENT:  Partial hearing loss  Eyes: Conjunctivae and EOM are normal. Pupils are equal, round, and reactive to light.  Neck: No JVD present. No tracheal deviation present. No thyromegaly present.  Cardiovascular: Normal rate and intact distal pulses.  Exam reveals no gallop and no friction rub.   Murmur (3/6/aortic ejection murmur) heard. Irregular. PVC.  Respiratory: No respiratory distress. She has no wheezes. She has no rales. She exhibits no tenderness.  GI: She exhibits no distension and no mass. There is no tenderness.  Musculoskeletal: Normal range of motion. She exhibits no edema and no tenderness.  Lymphadenopathy:    She has no cervical adenopathy.  Neurological: She is alert. She has normal reflexes. No cranial nerve deficit. Coordination normal.  Demented  Skin: No rash noted. No erythema. No pallor.  Psychiatric: She has a normal mood and affect. Her behavior is normal.       Labs  reviewed: Office Visit on 01/12/2014  Component Date Value Ref Range Status  . Glucose 01/12/2014 80  65 - 99 mg/dL Final  . BUN 01/12/2014 43* 8 - 27 mg/dL Final  . Creatinine, Ser 01/12/2014 0.91  0.57 - 1.00 mg/dL Final  . GFR calc non Af Amer 01/12/2014 56* >59 mL/min/1.73 Final  . GFR calc Af Amer 01/12/2014 65  >59 mL/min/1.73 Final  . BUN/Creatinine Ratio 01/12/2014 47* 11 - 26 Final  . Sodium 01/12/2014 142  134 - 144 mmol/L Final  . Potassium 01/12/2014 4.8  3.5 - 5.2 mmol/L Final  . Chloride 01/12/2014 100  97 - 108 mmol/L Final  . CO2 01/12/2014 22  18 - 29 mmol/L Final  . Calcium 01/12/2014 9.1  8.7 - 10.3 mg/dL Final  Admission on 10/19/2013, Discharged on 10/22/2013  Component Date Value Ref Range Status  . WBC 10/19/2013 15.5* 4.0 - 10.5 K/uL Final  . RBC 10/19/2013 3.44* 3.87 - 5.11 MIL/uL Final  . Hemoglobin 10/19/2013 9.7* 12.0 - 15.0 g/dL Final  . HCT 10/19/2013 29.5* 36.0 - 46.0 % Final  . MCV 10/19/2013 85.8  78.0 - 100.0 fL Final  . MCH 10/19/2013 28.2  26.0 - 34.0 pg Final  . MCHC 10/19/2013 32.9  30.0 - 36.0 g/dL Final  . RDW 10/19/2013 14.3  11.5 - 15.5 % Final  . Platelets 10/19/2013 478* 150 - 400 K/uL Final  . Neutrophils Relative % 10/19/2013 88* 43 - 77 % Final  . Neutro Abs 10/19/2013 13.6* 1.7 - 7.7 K/uL Final  . Lymphocytes Relative 10/19/2013 8* 12 - 46 % Final  . Lymphs Abs 10/19/2013 1.3  0.7 - 4.0 K/uL Final  . Monocytes Relative 10/19/2013 4  3 - 12 % Final  . Monocytes Absolute 10/19/2013 0.6  0.1 - 1.0 K/uL Final  . Eosinophils Relative 10/19/2013 0  0 - 5 % Final  . Eosinophils Absolute 10/19/2013 0.0  0.0 - 0.7 K/uL Final  . Basophils Relative 10/19/2013 0  0 - 1 % Final  . Basophils Absolute 10/19/2013 0.0  0.0 - 0.1 K/uL Final  . Sodium 10/19/2013 133* 137 - 147 mEq/L Final   Please note change in reference range.  . Potassium 10/19/2013 3.9  3.7 - 5.3 mEq/L Final   Please note change in reference range.  . Chloride 10/19/2013 92* 96  - 112 mEq/L Final  . CO2 10/19/2013 28  19 - 32 mEq/L Final  . Glucose, Bld 10/19/2013 90  70 - 99 mg/dL Final  . BUN 10/19/2013 41* 6 - 23 mg/dL Final  . Creatinine, Ser 10/19/2013 1.42* 0.50 - 1.10 mg/dL Final  . Calcium 10/19/2013 9.0  8.4 - 10.5 mg/dL Final  . GFR calc non Af Amer 10/19/2013 32* >90 mL/min Final  . GFR calc Af Amer 10/19/2013 37* >90 mL/min Final   Comment: (NOTE)                          The eGFR has been calculated using the CKD EPI equation.                          This calculation has not been validated in all clinical situations.                          eGFR's persistently <90 mL/min signify possible Chronic Kidney                          Disease.  . Color, Urine 10/19/2013 YELLOW  YELLOW Final  . APPearance 10/19/2013 TURBID* CLEAR Final  . Specific Gravity, Urine 10/19/2013 1.016  1.005 - 1.030 Final  . pH 10/19/2013 5.0  5.0 - 8.0 Final  . Glucose, UA 10/19/2013 NEGATIVE  NEGATIVE mg/dL Final  . Hgb urine dipstick 10/19/2013 NEGATIVE  NEGATIVE Final  . Bilirubin Urine 10/19/2013 NEGATIVE  NEGATIVE Final  . Ketones, ur 10/19/2013 NEGATIVE  NEGATIVE mg/dL Final  .  Protein, ur 10/19/2013 30* NEGATIVE mg/dL Final  . Urobilinogen, UA 10/19/2013 0.2  0.0 - 1.0 mg/dL Final  . Nitrite 10/19/2013 NEGATIVE  NEGATIVE Final  . Leukocytes, UA 10/19/2013 SMALL* NEGATIVE Final  . Squamous Epithelial / LPF 10/19/2013 RARE  RARE Final  . WBC, UA 10/19/2013 0-2  <3 WBC/hpf Final  . RBC / HPF 10/19/2013 0-2  <3 RBC/hpf Final  . Casts 10/19/2013 GRANULAR CAST* NEGATIVE Final  . Urine-Other 10/19/2013 AMORPHOUS URATES/PHOSPHATES   Final  . Specimen Description 10/19/2013 URINE, CATHETERIZED   Final  . Special Requests 10/19/2013 NONE   Final  . Culture  Setup Time 10/19/2013    Final                   Value:10/20/2013 03:11                         Performed at [facility]  . Colony Count 10/19/2013    Final                   Value:NO GROWTH                          Performed at [facility]  . Culture 10/19/2013    Final                   Value:NO GROWTH                         Performed at [facility]  . Report Status 10/19/2013 10/20/2013 FINAL   Final  . Specimen Description 10/19/2013 BLOOD LEFT ARM   Final  . Special Requests 10/19/2013 BOTTLES DRAWN AEROBIC ONLY 10CC   Final  . Culture  Setup Time 10/19/2013    Final                   Value:10/20/2013 01:08                         Performed at [facility]  . Culture 10/19/2013    Final                   Value:NO GROWTH 5 DAYS                         Performed at [facility]  . Report Status 10/19/2013 10/26/2013 FINAL   Final  . Specimen Description 10/19/2013 BLOOD RIGHT HAND   Final  . Special Requests 10/19/2013 BOTTLES DRAWN AEROBIC ONLY 10CC   Final  . Culture  Setup Time 10/19/2013    Final                   Value:10/20/2013 01:08                         Performed at [facility]  . Culture 10/19/2013    Final                   Value:NO GROWTH 5 DAYS                         Performed at [facility]  . Report Status 10/19/2013 10/26/2013 FINAL   Final  . Specimen Description 10/20/2013  SPUTUM   Final  . Special Requests 10/20/2013 NONE   Final  . Sputum evaluation 10/20/2013 THIS SPECIMEN IS ACCEPTABLE. RESPIRATORY CULTURE REPORT TO FOLLOW.   Final  . Report Status 10/20/2013 10/20/2013 FINAL   Final  . Specimen Description 10/20/2013 URINE, RANDOM   Final  . Special Requests 10/20/2013 NONE   Final  . Legionella Antigen, Urine 10/20/2013    Final                   Value:Negative for Legionella pneumophilia serogroup 1                         Performed at [facility]  . Report Status 10/20/2013 10/20/2013 FINAL   Final  . Strep Pneumo Urinary Antigen 10/20/2013 POSITIVE* NEGATIVE Final  . Sodium 10/20/2013 133* 137 - 147 mEq/L Final   Please note change in reference range.  . Potassium 10/20/2013 4.1  3.7 - 5.3 mEq/L  Final   Please note change in reference range.  . Chloride 10/20/2013 96  96 - 112 mEq/L Final  . CO2 10/20/2013 22  19 - 32 mEq/L Final  . Glucose, Bld 10/20/2013 99  70 - 99 mg/dL Final  . BUN 10/20/2013 26* 6 - 23 mg/dL Final  . Creatinine, Ser 10/20/2013 1.18* 0.50 - 1.10 mg/dL Final  . Calcium 10/20/2013 8.2* 8.4 - 10.5 mg/dL Final  . GFR calc non Af Amer 10/20/2013 40* >90 mL/min Final  . GFR calc Af Amer 10/20/2013 46* >90 mL/min Final   Comment: (NOTE)                          The eGFR has been calculated using the CKD EPI equation.                          This calculation has not been validated in all clinical situations.                          eGFR's persistently <90 mL/min signify possible Chronic Kidney                          Disease.  . WBC 10/20/2013 15.5* 4.0 - 10.5 K/uL Final  . RBC 10/20/2013 3.24* 3.87 - 5.11 MIL/uL Final  . Hemoglobin 10/20/2013 9.0* 12.0 - 15.0 g/dL Final  . HCT 10/20/2013 27.6* 36.0 - 46.0 % Final  . MCV 10/20/2013 85.2  78.0 - 100.0 fL Final  . MCH 10/20/2013 27.8  26.0 - 34.0 pg Final  . MCHC 10/20/2013 32.6  30.0 - 36.0 g/dL Final  . RDW 10/20/2013 14.1  11.5 - 15.5 % Final  . Platelets 10/20/2013 444* 150 - 400 K/uL Final  . Specimen Description 10/20/2013 SPUTUM   Final  . Special Requests 10/20/2013 NONE   Final  . Gram Stain 10/20/2013    Final                   Value:ABUNDANT WBC PRESENT,BOTH PMN AND MONONUCLEAR                         FEW SQUAMOUS EPITHELIAL CELLS PRESENT                         ABUNDANT  GRAM POSITIVE RODS                         RARE GRAM POSITIVE COCCI                         IN PAIRS  . Culture 10/20/2013    Final                   Value:NORMAL OROPHARYNGEAL FLORA                         Performed at [facility]  . Report Status 10/20/2013 10/22/2013 FINAL   Final  . WBC 10/22/2013 10.7* 4.0 - 10.5 K/uL Final  . RBC 10/22/2013 3.28* 3.87 - 5.11 MIL/uL Final  . Hemoglobin 10/22/2013 9.4* 12.0 - 15.0  g/dL Final  . HCT 10/22/2013 28.2* 36.0 - 46.0 % Final  . MCV 10/22/2013 86.0  78.0 - 100.0 fL Final  . MCH 10/22/2013 28.7  26.0 - 34.0 pg Final  . MCHC 10/22/2013 33.3  30.0 - 36.0 g/dL Final  . RDW 10/22/2013 13.8  11.5 - 15.5 % Final  . Platelets 10/22/2013 548* 150 - 400 K/uL Final  . Sodium 10/22/2013 135* 137 - 147 mEq/L Final  . Potassium 10/22/2013 4.2  3.7 - 5.3 mEq/L Final  . Chloride 10/22/2013 99  96 - 112 mEq/L Final  . CO2 10/22/2013 23  19 - 32 mEq/L Final  . Glucose, Bld 10/22/2013 88  70 - 99 mg/dL Final  . BUN 10/22/2013 12  6 - 23 mg/dL Final  . Creatinine, Ser 10/22/2013 0.88  0.50 - 1.10 mg/dL Final  . Calcium 10/22/2013 8.6  8.4 - 10.5 mg/dL Final  . GFR calc non Af Amer 10/22/2013 57* >90 mL/min Final  . GFR calc Af Amer 10/22/2013 66* >90 mL/min Final   Comment: (NOTE)                          The eGFR has been calculated using the CKD EPI equation.                          This calculation has not been validated in all clinical situations.                          eGFR's persistently <90 mL/min signify possible Chronic Kidney                          Disease.  Admission on 09/22/2013, Discharged on 09/23/2013  Component Date Value Ref Range Status  . WBC 09/22/2013 9.2  4.0 - 10.5 K/uL Final  . RBC 09/22/2013 4.11  3.87 - 5.11 MIL/uL Final  . Hemoglobin 09/22/2013 11.8* 12.0 - 15.0 g/dL Final  . HCT 09/22/2013 36.7  36.0 - 46.0 % Final  . MCV 09/22/2013 89.3  78.0 - 100.0 fL Final  . MCH 09/22/2013 28.7  26.0 - 34.0 pg Final  . MCHC 09/22/2013 32.2  30.0 - 36.0 g/dL Final  . RDW 09/22/2013 15.7* 11.5 - 15.5 % Final  . Platelets 09/22/2013 232  150 - 400 K/uL Final  . Neutrophils Relative % 09/22/2013 82* 43 - 77 % Final  . Neutro Abs 09/22/2013 7.6  1.7 - 7.7 K/uL Final  .  Lymphocytes Relative 09/22/2013 11* 12 - 46 % Final  . Lymphs Abs 09/22/2013 1.0  0.7 - 4.0 K/uL Final  . Monocytes Relative 09/22/2013 7  3 - 12 % Final  . Monocytes Absolute  09/22/2013 0.6  0.1 - 1.0 K/uL Final  . Eosinophils Relative 09/22/2013 0  0 - 5 % Final  . Eosinophils Absolute 09/22/2013 0.0  0.0 - 0.7 K/uL Final  . Basophils Relative 09/22/2013 0  0 - 1 % Final  . Basophils Absolute 09/22/2013 0.0  0.0 - 0.1 K/uL Final  . Sodium 09/22/2013 137  135 - 145 mEq/L Final  . Potassium 09/22/2013 4.0  3.5 - 5.1 mEq/L Final  . Chloride 09/22/2013 100  96 - 112 mEq/L Final  . CO2 09/22/2013 28  19 - 32 mEq/L Final  . Glucose, Bld 09/22/2013 104* 70 - 99 mg/dL Final  . BUN 09/22/2013 30* 6 - 23 mg/dL Final  . Creatinine, Ser 09/22/2013 0.86  0.50 - 1.10 mg/dL Final  . Calcium 09/22/2013 9.3  8.4 - 10.5 mg/dL Final  . GFR calc non Af Amer 09/22/2013 58* >90 mL/min Final  . GFR calc Af Amer 09/22/2013 67* >90 mL/min Final   Comment: (NOTE)                          The eGFR has been calculated using the CKD EPI equation.                          This calculation has not been validated in all clinical situations.                          eGFR's persistently <90 mL/min signify possible Chronic Kidney                          Disease.  . Color, Urine 09/22/2013 YELLOW  YELLOW Final  . APPearance 09/22/2013 CLOUDY* CLEAR Final  . Specific Gravity, Urine 09/22/2013 1.017  1.005 - 1.030 Final  . pH 09/22/2013 5.5  5.0 - 8.0 Final  . Glucose, UA 09/22/2013 NEGATIVE  NEGATIVE mg/dL Final  . Hgb urine dipstick 09/22/2013 MODERATE* NEGATIVE Final  . Bilirubin Urine 09/22/2013 NEGATIVE  NEGATIVE Final  . Ketones, ur 09/22/2013 NEGATIVE  NEGATIVE mg/dL Final  . Protein, ur 09/22/2013 NEGATIVE  NEGATIVE mg/dL Final  . Urobilinogen, UA 09/22/2013 0.2  0.0 - 1.0 mg/dL Final  . Nitrite 09/22/2013 NEGATIVE  NEGATIVE Final  . Leukocytes, UA 09/22/2013 MODERATE* NEGATIVE Final  . Specimen Description 09/22/2013 URINE, RANDOM   Final  . Special Requests 09/22/2013 Normal   Final  . Culture  Setup Time 09/22/2013    Final                   Value:09/22/2013 16:50                          Performed at [facility]  . Colony Count 09/22/2013    Final                   Value:80,000 COLONIES/ML                         Performed at [facility]  . Culture 09/22/2013    Final  Value:Multiple bacterial morphotypes present, none predominant. Suggest appropriate recollection if clinically indicated.                         Performed at [facility]  . Report Status 09/22/2013 09/23/2013 FINAL   Final  . Troponin i, poc 09/22/2013 0.01  0.00 - 0.08 ng/mL Final  . Comment 3 09/22/2013          Final   Comment: Due to the release kinetics of cTnI,                          a negative result within the first hours                          of the onset of symptoms does not rule out                          myocardial infarction with certainty.                          If myocardial infarction is still suspected,                          repeat the test at appropriate intervals.  . Squamous Epithelial / LPF 09/22/2013 MANY* RARE Final  . WBC, UA 09/22/2013 3-6  <3 WBC/hpf Final  . RBC / HPF 09/22/2013 3-6  <3 RBC/hpf Final  . Bacteria, UA 09/22/2013 MANY* RARE Final  . Free T4 09/22/2013 0.94  0.80 - 1.80 ng/dL Final   Performed at [facility]  . Troponin I 09/22/2013 <0.30  <0.30 ng/mL Final   Comment:                                 Due to the release kinetics of cTnI,                          a negative result within the first hours                          of the onset of symptoms does not rule out                          myocardial infarction with certainty.                          If myocardial infarction is still suspected,                          repeat the test at appropriate intervals.  . Troponin I 09/22/2013 <0.30  <0.30 ng/mL Final   Comment:                                 Due to the release kinetics of cTnI,                          a negative result within the first hours  of the  onset of symptoms does not rule out                          myocardial infarction with certainty.                          If myocardial infarction is still suspected,                          repeat the test at appropriate intervals.  . Troponin I 09/23/2013 <0.30  <0.30 ng/mL Final   Comment:                                 Due to the release kinetics of cTnI,                          a negative result within the first hours                          of the onset of symptoms does not rule out                          myocardial infarction with certainty.                          If myocardial infarction is still suspected,                          repeat the test at appropriate intervals.  Marland Kitchen TSH 09/22/2013 3.070  0.350 - 4.500 uIU/mL Final   Performed at [facility]  . Sodium 09/23/2013 139  135 - 145 mEq/L Final  . Potassium 09/23/2013 3.7  3.5 - 5.1 mEq/L Final  . Chloride 09/23/2013 105  96 - 112 mEq/L Final  . CO2 09/23/2013 26  19 - 32 mEq/L Final  . Glucose, Bld 09/23/2013 89  70 - 99 mg/dL Final  . BUN 09/23/2013 30* 6 - 23 mg/dL Final  . Creatinine, Ser 09/23/2013 0.92  0.50 - 1.10 mg/dL Final  . Calcium 09/23/2013 8.5  8.4 - 10.5 mg/dL Final  . Total Protein 09/23/2013 6.4  6.0 - 8.3 g/dL Final  . Albumin 09/23/2013 2.8* 3.5 - 5.2 g/dL Final  . AST 09/23/2013 15  0 - 37 U/L Final  . ALT 09/23/2013 11  0 - 35 U/L Final  . Alkaline Phosphatase 09/23/2013 61  39 - 117 U/L Final  . Total Bilirubin 09/23/2013 0.5  0.3 - 1.2 mg/dL Final  . GFR calc non Af Amer 09/23/2013 54* >90 mL/min Final  . GFR calc Af Amer 09/23/2013 62* >90 mL/min Final   Comment: (NOTE)                          The eGFR has been calculated using the CKD EPI equation.                          This calculation has not been validated in all clinical situations.  eGFR's persistently <90 mL/min signify possible Chronic Kidney                          Disease.  . WBC  09/23/2013 6.5  4.0 - 10.5 K/uL Final  . RBC 09/23/2013 3.64* 3.87 - 5.11 MIL/uL Final  . Hemoglobin 09/23/2013 10.2* 12.0 - 15.0 g/dL Final  . HCT 09/23/2013 32.4* 36.0 - 46.0 % Final  . MCV 09/23/2013 89.0  78.0 - 100.0 fL Final  . MCH 09/23/2013 28.0  26.0 - 34.0 pg Final  . MCHC 09/23/2013 31.5  30.0 - 36.0 g/dL Final  . RDW 09/23/2013 15.6* 11.5 - 15.5 % Final  . Platelets 09/23/2013 220  150 - 400 K/uL Final  . Vitamin B-12 09/22/2013 868  211 - 911 pg/mL Final   Performed at [facility]      Assessment/Plan 1. Alzheimer's dementia with behavioral disturbance stable  2. Essential hypertension, benign controlled  3. Dysphagia Currently on regular diet and thin liquids  4. Hypothyroidism compensated

## 2014-01-26 ENCOUNTER — Other Ambulatory Visit: Payer: Self-pay | Admitting: *Deleted

## 2014-01-26 ENCOUNTER — Other Ambulatory Visit: Payer: Self-pay | Admitting: Internal Medicine

## 2014-01-26 MED ORDER — DONEPEZIL HCL 10 MG PO TABS: 10.0000 mg | ORAL_TABLET | Freq: Every day | ORAL | Status: DC

## 2014-01-26 MED ORDER — QUETIAPINE FUMARATE 25 MG PO TABS: ORAL_TABLET | ORAL | Status: DC

## 2014-01-26 NOTE — Telephone Encounter (Signed)
[facility] 

## 2014-01-27 ENCOUNTER — Telehealth: Payer: Self-pay | Admitting: *Deleted

## 2014-01-27 MED ORDER — QUETIAPINE FUMARATE 25 MG PO TABS: ORAL_TABLET | ORAL | Status: DC

## 2014-01-27 NOTE — Telephone Encounter (Signed)
Patient daughter called and stated that patient is out of Seroquel because they have been giving patient Seroquel 25mg  at bedtime instead of 12.5mg . Wants a Rx called in for the 25mg . I spoke with Dr. Bubba Camp and Dr. Nyoka Cowden regarding this and Dr. Nyoka Cowden stated to go ahead and call in the Seroquel 25 mg once at bedtime.Daughter Notified. Faxed Rx into pharmacy

## 2014-01-28 ENCOUNTER — Other Ambulatory Visit: Payer: Self-pay | Admitting: *Deleted

## 2014-01-28 MED ORDER — QUETIAPINE FUMARATE 25 MG PO TABS: ORAL_TABLET | ORAL | Status: DC

## 2014-01-28 NOTE — Telephone Encounter (Signed)
[facility] 

## 2014-02-03 ENCOUNTER — Emergency Department (HOSPITAL_COMMUNITY)
Admission: EM | Admit: 2014-02-03 | Discharge: 2014-02-03 | Disposition: A | Discharge status: Home / Self Care | Payer: Medicare Other | Attending: Emergency Medicine | Admitting: Emergency Medicine

## 2014-02-03 ENCOUNTER — Emergency Department (HOSPITAL_COMMUNITY): Payer: Medicare Other

## 2014-02-03 ENCOUNTER — Encounter (HOSPITAL_COMMUNITY): Payer: Self-pay | Admitting: Emergency Medicine

## 2014-02-03 DIAGNOSIS — W19XXXA Unspecified fall, initial encounter: Secondary | ICD-10-CM

## 2014-02-03 DIAGNOSIS — Y929 Unspecified place or not applicable: Secondary | ICD-10-CM | POA: Insufficient documentation

## 2014-02-03 DIAGNOSIS — R55 Syncope and collapse: Secondary | ICD-10-CM

## 2014-02-03 DIAGNOSIS — Z791 Long term (current) use of non-steroidal anti-inflammatories (NSAID): Secondary | ICD-10-CM | POA: Insufficient documentation

## 2014-02-03 DIAGNOSIS — F329 Major depressive disorder, single episode, unspecified: Secondary | ICD-10-CM | POA: Insufficient documentation

## 2014-02-03 DIAGNOSIS — Z8669 Personal history of other diseases of the nervous system and sense organs: Secondary | ICD-10-CM | POA: Insufficient documentation

## 2014-02-03 DIAGNOSIS — G309 Alzheimer's disease, unspecified: Secondary | ICD-10-CM | POA: Insufficient documentation

## 2014-02-03 DIAGNOSIS — F0280 Dementia in other diseases classified elsewhere without behavioral disturbance: Secondary | ICD-10-CM | POA: Insufficient documentation

## 2014-02-03 DIAGNOSIS — Z87891 Personal history of nicotine dependence: Secondary | ICD-10-CM | POA: Insufficient documentation

## 2014-02-03 DIAGNOSIS — Z8639 Personal history of other endocrine, nutritional and metabolic disease: Secondary | ICD-10-CM | POA: Insufficient documentation

## 2014-02-03 DIAGNOSIS — IMO0002 Reserved for concepts with insufficient information to code with codable children: Secondary | ICD-10-CM | POA: Insufficient documentation

## 2014-02-03 DIAGNOSIS — Z862 Personal history of diseases of the blood and blood-forming organs and certain disorders involving the immune mechanism: Secondary | ICD-10-CM | POA: Insufficient documentation

## 2014-02-03 DIAGNOSIS — R064 Hyperventilation: Secondary | ICD-10-CM | POA: Insufficient documentation

## 2014-02-03 DIAGNOSIS — Y939 Activity, unspecified: Secondary | ICD-10-CM | POA: Insufficient documentation

## 2014-02-03 DIAGNOSIS — F411 Generalized anxiety disorder: Secondary | ICD-10-CM | POA: Insufficient documentation

## 2014-02-03 DIAGNOSIS — Z79899 Other long term (current) drug therapy: Secondary | ICD-10-CM | POA: Insufficient documentation

## 2014-02-03 DIAGNOSIS — Z043 Encounter for examination and observation following other accident: Secondary | ICD-10-CM | POA: Insufficient documentation

## 2014-02-03 DIAGNOSIS — G4700 Insomnia, unspecified: Secondary | ICD-10-CM | POA: Insufficient documentation

## 2014-02-03 DIAGNOSIS — F3289 Other specified depressive episodes: Secondary | ICD-10-CM | POA: Insufficient documentation

## 2014-02-03 DIAGNOSIS — R296 Repeated falls: Secondary | ICD-10-CM | POA: Insufficient documentation

## 2014-02-03 DIAGNOSIS — I10 Essential (primary) hypertension: Secondary | ICD-10-CM | POA: Insufficient documentation

## 2014-02-03 DIAGNOSIS — Z853 Personal history of malignant neoplasm of breast: Secondary | ICD-10-CM | POA: Insufficient documentation

## 2014-02-03 DIAGNOSIS — M1710 Unilateral primary osteoarthritis, unspecified knee: Secondary | ICD-10-CM | POA: Insufficient documentation

## 2014-02-03 LAB — BASIC METABOLIC PANEL
BUN: 36 mg/dL — ABNORMAL HIGH (ref 6–23)
CO2: 28 mEq/L (ref 19–32)
Chloride: 100 mEq/L (ref 96–112)
Creatinine, Ser: 1.12 mg/dL — ABNORMAL HIGH (ref 0.50–1.10)
GFR calc non Af Amer: 42 mL/min — ABNORMAL LOW (ref >90)
Potassium: 4.4 mEq/L (ref 3.7–5.3)

## 2014-02-03 LAB — CBC
HCT: 36.3 % (ref 36.0–46.0)
Hemoglobin: 11.5 g/dL — ABNORMAL LOW (ref 12.0–15.0)
MCH: 28.5 pg (ref 26.0–34.0)
MCHC: 31.7 g/dL (ref 30.0–36.0)
MCV: 89.9 fL (ref 78.0–100.0)
Platelets: 240 K/uL (ref 150–400)
RBC: 4.04 MIL/uL (ref 3.87–5.11)
RDW: 14.4 % (ref 11.5–15.5)
WBC: 7.5 10*3/uL (ref 4.0–10.5)

## 2014-02-03 LAB — I-STAT TROPONIN, ED: Troponin i, poc: 0.01 ng/mL (ref 0.00–0.08)

## 2014-02-03 LAB — BASIC METABOLIC PANEL WITH GFR
Calcium: 9.6 mg/dL (ref 8.4–10.5)
GFR calc Af Amer: 49 mL/min — ABNORMAL LOW (ref >90)
Glucose, Bld: 116 mg/dL — ABNORMAL HIGH (ref 70–99)
Sodium: 139 meq/L (ref 137–147)

## 2014-02-03 MED ORDER — ALPRAZOLAM 0.25 MG PO TABS
0.5000 mg | ORAL_TABLET | Freq: Once | ORAL | Status: AC
  Administered 2014-02-03: 0.5 mg via ORAL
  Filled 2014-02-03: qty 2

## 2014-02-03 NOTE — Progress Notes (Addendum)
ED CM received referral from CSW  Concerning possible HH. Pt presented to [facility] ED with CP, and s/p fall. Pt is a 80 yo F with hx of dementia, alzheimer. Met with patient and daughters at bedside. Pt lives with caregiver  daughter Jackelyn Poling [phone]. Confirmed  Information. Daughters voiced a genuine concern for patient's safety. As per daughter, patient has started wandering, and forgetting where she lives. She has become increasingly more difficult to manage at home alone. Medicare Lynda Rainwater covered. Discussed the recommendations for [facility] and the services to manage at home. Pt and family is agreeable to this plan. Offered choice [facility] agency list provided. Previous services with [facility], family was pleased and selected [facility] again. RN, PT, SW ordered. Discussed that SW can assist with facilitating NH placement.Patient and family verbalized understanding and is agreeable to discharge plan. Family voiced the  appreciation for the assistance with care coordination.  [facility] Referral placed by faxed to [phone]. Received fax confirmation. Provided family with [facility] brochure and explained that someone should contact them in 24-48 hours. Provided family with my contact information to contact me if any questions or concerns arises. Family verbalized understanding and is agreeable to the discharge plan, Discussed discharge plan with Dr. Dorna Mai who is agreeable with plan. ED CM informed family ED CM and ED CSW will follow up with patient and family on 4/22.

## 2014-02-03 NOTE — ED Provider Notes (Signed)
CSN: [id number]     Arrival date & time 02/03/14  1620 History   First MD Initiated Contact with Patient 02/03/14 1639     Chief Complaint  Patient presents with  . Chest Pain     (Consider location/radiation/quality/duration/timing/severity/associated sxs/prior Treatment) HPI Comments: Pt got aggravated today after she reportedly fell and her daughters were trying to evaluate her.  Pt reportedly was hyperventilating, and reportedly fainted in the car for a brief period of time.  Pt has had several prior syncopal episodes in the past, was admitted for similar in December and was deemed not to be seizures and a benign cause of syncope related to pain and likely vagal.  Pt had some chest tightness in central substernal area lasting maybe 10 minutes, but all symptoms resolved now.  Pt reports no pain anywhere.  Family also is concerned because they feel pt is always at risk for falling and they have been working with PCP, social work, [employer] office and skilled facilities trying to get her placed but have not been able to.  They voice significant frustration.  Pt denies currently HA, neck pain, CP, SOB, abd pain, hip pain, shoulder pain, back pain, leg pains.    Patient is a 80 y.o. female presenting with chest pain. The history is provided by the patient, a relative and medical records.  Chest Pain   Past Medical History  Diagnosis Date  . Vitamin D deficiency   . Insomnia, unspecified   . Other specified cardiac dysrhythmias(427.89)   . Nausea with vomiting   . Dysphagia, oral phase   . Loss of weight   . Hyperpotassemia   . Dizziness and giddiness   . Chronic lymphocytic thyroiditis   . Other and unspecified hyperlipidemia   . Hypothyroidism   . Unspecified protein-calorie malnutrition   . Osteoarthritis, knee   . Senile osteoporosis   . Anxiety and depression   . Breast cancer     "right" (09/22/2013)  . Benign hypertension     "took off BP meds this fall" (09/22/2013)  .  Alzheimer's disease     "probably moderate" (09/22/2013)  . Anxiety   . Seizures     "might have had one today; not sure" (09/22/2013   Past Surgical History  Procedure Laterality Date  . Eye surgery    . Knee surgery Right 1940's    "cyst on her knee" (09/22/2013)  . Breast lumpectomy Right 1995   Family History  Problem Relation Age of Onset  . Pneumonia Mother   . Heart attack Father   . Pneumonia Brother    History  Substance Use Topics  . Smoking status: Former Smoker -- 1.00 packs/day for 20 years    Types: Cigarettes  . Smokeless tobacco: Never Used     Comment: 09/22/2013 "quite smoking ~ 1960's"  . Alcohol Use: 4.2 oz/week    7 Cans of beer per week     Comment: 09/22/2013 "beer q hs"   OB History   Grav Para Term Preterm Abortions TAB SAB Ect Mult Living                 Review of Systems  Unable to perform ROS: Dementia  Cardiovascular: Positive for chest pain.      Allergies  Namenda; Tessalon; and Lorazepam  Home Medications   Prior to Admission medications   Medication Sig Start Date End Date Taking? Authorizing Provider  acetaminophen (TYLENOL) 500 MG tablet Take 500-1,000 mg by mouth every 8 (eight)  hours as needed for mild pain.    Yes Historical Provider, MD  ALPRAZolam Duanne Moron) 0.5 MG tablet Take 1 tablet (0.5 mg total) by mouth 3 (three) times daily as needed for anxiety. 01/12/14  Yes Tiffany L Reed, DO  diclofenac sodium (VOLTAREN) 1 % GEL Apply 4 g topically 4 (four) times daily. 10/28/13  Yes Mahima Pandey, MD  donepezil (ARICEPT) 10 MG tablet Take 1 tablet (10 mg total) by mouth at bedtime. For memory 01/26/14  Yes Tiffany L Reed, DO  NON FORMULARY Take 1 each by mouth daily. "herbal life" shakes   Yes Historical Provider, MD  OVER THE COUNTER MEDICATION Take 30-45 mLs by mouth daily. "exzona" in with shakes   Yes Historical Provider, MD  QUEtiapine (SEROQUEL) 25 MG tablet Take 25 mg by mouth at bedtime.   Yes Historical Provider, MD   BP 181/83   Pulse 69  Temp(Src) 98.2 F (36.8 C) (Oral)  Resp 18  Wt 92 lb 6 oz (41.901 kg)  SpO2 100% Physical Exam  Nursing note and vitals reviewed. Constitutional: She appears well-developed and well-nourished.  HENT:  Head: Normocephalic and atraumatic.  Eyes: EOM are normal. No scleral icterus.  Neck: Normal range of motion. Neck supple.  Cardiovascular: Normal rate, regular rhythm and intact distal pulses.   Occasional extrasystoles are present.  No murmur heard. Pulmonary/Chest: Effort normal. No respiratory distress. She has no wheezes. She has no rales.  Abdominal: Soft. She exhibits no distension. There is no tenderness. There is no rebound and no guarding.  Neurological: She is alert.  Skin: Skin is warm and dry.    ED Course  Procedures (including critical care time) Labs Review Labs Reviewed  CBC - Abnormal; Notable for the following:    Hemoglobin 11.5 (*)    All other components within normal limits  BASIC METABOLIC PANEL - Abnormal; Notable for the following:    Glucose, Bld 116 (*)    BUN 36 (*)    Creatinine, Ser 1.12 (*)    GFR calc non Af Amer 42 (*)    GFR calc Af Amer 49 (*)    All other components within normal limits  Randolm Idol, ED    Imaging Review Dg Chest 2 View  02/03/2014   CLINICAL DATA:  CHEST PAIN CHEST PAIN  EXAM: CHEST  2 VIEW  COMPARISON:  None.  FINDINGS: The heart size and mediastinal contours are within normal limits. The aorta is tortuous. The lungs are hyperinflated. No focal regions of consolidation or focal infiltrates. S-shaped scoliosis within the thoracolumbar spine. Degenerative changes appreciated within the shoulders and mid thoracic spine.  IMPRESSION: COPD.  No acute cardiopulmonary disease.   Electronically Signed   By: Margaree Mackintosh M.D.   On: 02/03/2014 18:00     EKG Interpretation   Date/Time:  Tuesday February 03 2014 16:27:22 EDT Ventricular Rate:  73 PR Interval:  140 QRS Duration: 84 QT Interval:  390 QTC  Calculation: 429 R Axis:   83 Text Interpretation:  Normal sinus rhythm Normal ECG PAC's no longer seen  in comparison with last ECG on 10/19/13 Confirmed by [employer]  MD, MICHEAL  ([id number]) on 02/03/2014 4:52:43 PM     RA sat is 98% and I interpret to be adequate   6:42PM Labs are baseline.  Troponin is normal.  I have asked case management and social work to discuss different options with family for assistance at home.     Pt seen by both SW and CM  and have placed order for home assistance.  MDM   Final diagnoses:  Syncope  Fall    Pt appears well.  Sounds like pt may have been hyperventilating and may have had a vagal event.  No seizure like activity voiced and pt has been evaluated for seizure in the past.  ECG shows no ischemia.  Will check basic labs, troponin.  Will have social work see pt.  Pt has no tenderness or complaints of pain anywhere currently.  Pt has baseline anemia, will check CBC and BMP as well.    Saddie Benders. Dorna Mai, MD 02/05/14 [phone]

## 2014-02-03 NOTE — ED Notes (Signed)
Case management/social work in room with patient at this time.

## 2014-02-03 NOTE — Progress Notes (Signed)
Spoke with pt's daughters re: NHP for pt. Pt has become increasingly harder to manage over recent months and family has concerns for her safety. Pt is Medicaid pending and daughters have been looking at [facility].  Daughters are frustrated by the whole process of trying to place pt and have been told different things by different facilities/Medicaid.  Supportive counseling provided.  CSW explained that pt would need to meet Medicare criteria for an IP admission of 3 midnights in order for Medicare to pay for NHP for pt.  Pt/family unable to private pay for placement and are not interested in adult day care programs for pt. If pt is not admitted, CSW would recommend [facility], including a HH SW to assist with pt placement from the home.

## 2014-02-03 NOTE — ED Notes (Signed)
MD at bedside. 

## 2014-02-03 NOTE — ED Notes (Signed)
Per family sts pt had an episode in the car of chest pain and SOB. Pt very upset. Pt hx of alzheimers and denies any symptoms currently. Pt is stating it previously was hurting in the middle of her chest.

## 2014-02-05 ENCOUNTER — Ambulatory Visit: Payer: Medicare Other | Admitting: Internal Medicine

## 2014-02-05 DIAGNOSIS — E46 Unspecified protein-calorie malnutrition: Secondary | ICD-10-CM

## 2014-02-05 DIAGNOSIS — F0390 Unspecified dementia without behavioral disturbance: Secondary | ICD-10-CM

## 2014-02-05 DIAGNOSIS — R1310 Dysphagia, unspecified: Secondary | ICD-10-CM

## 2014-02-05 DIAGNOSIS — I498 Other specified cardiac arrhythmias: Secondary | ICD-10-CM

## 2014-02-05 NOTE — Progress Notes (Signed)
ED CM contacted Daughter Jackelyn Poling concerning update. Informed daughter that Gwendalyn Ege had been completed by ED CSW.  Encouraged  Family to continue to work with the facility of their choice Chief of Staff), to obtain [facility]  Placement. Daughter verbalized understanding and teach back. No further ED CM need identified.

## 2014-09-03 ENCOUNTER — Telehealth: Payer: Self-pay | Admitting: Internal Medicine

## 2014-09-03 NOTE — Telephone Encounter (Signed)
Spoke with pts daughter Theresa Rodriguez.  Theresa Rodriguez is in a [place].  cdavis

## 2015-05-31 ENCOUNTER — Encounter (HOSPITAL_COMMUNITY): Payer: Self-pay | Admitting: *Deleted

## 2015-05-31 ENCOUNTER — Inpatient Hospital Stay (HOSPITAL_COMMUNITY)
Admission: EM | Admit: 2015-05-31 | Discharge: 2015-06-04 | DRG: 470 | Disposition: A | Discharge status: Skilled Nursing Facility | Payer: Medicare Other | Attending: Internal Medicine | Admitting: Internal Medicine

## 2015-05-31 ENCOUNTER — Emergency Department (HOSPITAL_COMMUNITY): Payer: Medicare Other

## 2015-05-31 DIAGNOSIS — N183 Chronic kidney disease, stage 3 unspecified: Secondary | ICD-10-CM | POA: Diagnosis present

## 2015-05-31 DIAGNOSIS — E785 Hyperlipidemia, unspecified: Secondary | ICD-10-CM | POA: Diagnosis present

## 2015-05-31 DIAGNOSIS — Y92122 Bedroom in nursing home as the place of occurrence of the external cause: Secondary | ICD-10-CM

## 2015-05-31 DIAGNOSIS — Z66 Do not resuscitate: Secondary | ICD-10-CM | POA: Diagnosis present

## 2015-05-31 DIAGNOSIS — F410 Panic disorder [episodic paroxysmal anxiety] without agoraphobia: Secondary | ICD-10-CM | POA: Diagnosis present

## 2015-05-31 DIAGNOSIS — E559 Vitamin D deficiency, unspecified: Secondary | ICD-10-CM | POA: Diagnosis present

## 2015-05-31 DIAGNOSIS — M25551 Pain in right hip: Secondary | ICD-10-CM | POA: Diagnosis present

## 2015-05-31 DIAGNOSIS — E860 Dehydration: Secondary | ICD-10-CM | POA: Diagnosis present

## 2015-05-31 DIAGNOSIS — F0281 Dementia in other diseases classified elsewhere with behavioral disturbance: Secondary | ICD-10-CM | POA: Diagnosis present

## 2015-05-31 DIAGNOSIS — G309 Alzheimer's disease, unspecified: Secondary | ICD-10-CM | POA: Diagnosis present

## 2015-05-31 DIAGNOSIS — I129 Hypertensive chronic kidney disease with stage 1 through stage 4 chronic kidney disease, or unspecified chronic kidney disease: Secondary | ICD-10-CM | POA: Diagnosis present

## 2015-05-31 DIAGNOSIS — Z87891 Personal history of nicotine dependence: Secondary | ICD-10-CM | POA: Diagnosis not present

## 2015-05-31 DIAGNOSIS — Z79899 Other long term (current) drug therapy: Secondary | ICD-10-CM

## 2015-05-31 DIAGNOSIS — Z96649 Presence of unspecified artificial hip joint: Secondary | ICD-10-CM

## 2015-05-31 DIAGNOSIS — F329 Major depressive disorder, single episode, unspecified: Secondary | ICD-10-CM | POA: Diagnosis present

## 2015-05-31 DIAGNOSIS — Z853 Personal history of malignant neoplasm of breast: Secondary | ICD-10-CM | POA: Diagnosis not present

## 2015-05-31 DIAGNOSIS — Z7982 Long term (current) use of aspirin: Secondary | ICD-10-CM | POA: Diagnosis not present

## 2015-05-31 DIAGNOSIS — F32A Depression, unspecified: Secondary | ICD-10-CM | POA: Diagnosis present

## 2015-05-31 DIAGNOSIS — I10 Essential (primary) hypertension: Secondary | ICD-10-CM | POA: Diagnosis present

## 2015-05-31 DIAGNOSIS — M810 Age-related osteoporosis without current pathological fracture: Secondary | ICD-10-CM | POA: Diagnosis present

## 2015-05-31 DIAGNOSIS — D62 Acute posthemorrhagic anemia: Secondary | ICD-10-CM | POA: Diagnosis not present

## 2015-05-31 DIAGNOSIS — S72091A Other fracture of head and neck of right femur, initial encounter for closed fracture: Principal | ICD-10-CM | POA: Diagnosis present

## 2015-05-31 DIAGNOSIS — Z8249 Family history of ischemic heart disease and other diseases of the circulatory system: Secondary | ICD-10-CM

## 2015-05-31 DIAGNOSIS — F419 Anxiety disorder, unspecified: Secondary | ICD-10-CM

## 2015-05-31 DIAGNOSIS — S72001A Fracture of unspecified part of neck of right femur, initial encounter for closed fracture: Secondary | ICD-10-CM | POA: Diagnosis present

## 2015-05-31 DIAGNOSIS — E039 Hypothyroidism, unspecified: Secondary | ICD-10-CM | POA: Diagnosis present

## 2015-05-31 DIAGNOSIS — D649 Anemia, unspecified: Secondary | ICD-10-CM | POA: Diagnosis present

## 2015-05-31 DIAGNOSIS — G308 Other Alzheimer's disease: Secondary | ICD-10-CM | POA: Diagnosis not present

## 2015-05-31 DIAGNOSIS — S72001K Fracture of unspecified part of neck of right femur, subsequent encounter for closed fracture with nonunion: Secondary | ICD-10-CM | POA: Diagnosis not present

## 2015-05-31 DIAGNOSIS — F02818 Dementia in other diseases classified elsewhere, unspecified severity, with other behavioral disturbance: Secondary | ICD-10-CM | POA: Diagnosis present

## 2015-05-31 DIAGNOSIS — F418 Other specified anxiety disorders: Secondary | ICD-10-CM | POA: Diagnosis not present

## 2015-05-31 DIAGNOSIS — W19XXXA Unspecified fall, initial encounter: Secondary | ICD-10-CM | POA: Diagnosis present

## 2015-05-31 DIAGNOSIS — S72001D Fracture of unspecified part of neck of right femur, subsequent encounter for closed fracture with routine healing: Secondary | ICD-10-CM | POA: Diagnosis not present

## 2015-05-31 DIAGNOSIS — S72001G Fracture of unspecified part of neck of right femur, subsequent encounter for closed fracture with delayed healing: Secondary | ICD-10-CM | POA: Diagnosis not present

## 2015-05-31 LAB — URINALYSIS, ROUTINE W REFLEX MICROSCOPIC
BILIRUBIN URINE: NEGATIVE
GLUCOSE, UA: NEGATIVE mg/dL
KETONES UR: NEGATIVE mg/dL
Leukocytes, UA: NEGATIVE
Nitrite: NEGATIVE
PH: 7.5 (ref 5.0–8.0)
PROTEIN: 30 mg/dL — AB
Specific Gravity, Urine: 1.01 (ref 1.005–1.030)
Urobilinogen, UA: 0.2 mg/dL (ref 0.0–1.0)

## 2015-05-31 LAB — CBC WITH DIFFERENTIAL/PLATELET
BASOS PCT: 0 % (ref 0–1)
Basophils Absolute: 0 10*3/uL (ref 0.0–0.1)
EOS ABS: 0.1 10*3/uL (ref 0.0–0.7)
Eosinophils Relative: 1 % (ref 0–5)
HEMATOCRIT: 35 % — AB (ref 36.0–46.0)
Hemoglobin: 11 g/dL — ABNORMAL LOW (ref 12.0–15.0)
LYMPHS ABS: 1 10*3/uL (ref 0.7–4.0)
Lymphocytes Relative: 16 % (ref 12–46)
MCH: 30 pg (ref 26.0–34.0)
MCHC: 31.4 g/dL (ref 30.0–36.0)
MCV: 95.4 fL (ref 78.0–100.0)
MONO ABS: 0.5 10*3/uL (ref 0.1–1.0)
Monocytes Relative: 8 % (ref 3–12)
Neutro Abs: 4.7 10*3/uL (ref 1.7–7.7)
Neutrophils Relative %: 75 % (ref 43–77)
Platelets: 167 10*3/uL (ref 150–400)
RBC: 3.67 MIL/uL — ABNORMAL LOW (ref 3.87–5.11)
RDW: 14.2 % (ref 11.5–15.5)
WBC: 6.3 10*3/uL (ref 4.0–10.5)

## 2015-05-31 LAB — PROTIME-INR
INR: 1.05 (ref 0.00–1.49)
PROTHROMBIN TIME: 13.9 s (ref 11.6–15.2)

## 2015-05-31 LAB — BASIC METABOLIC PANEL
Anion gap: 10 (ref 5–15)
BUN: 39 mg/dL — ABNORMAL HIGH (ref 6–20)
CALCIUM: 9.3 mg/dL (ref 8.9–10.3)
CO2: 31 mmol/L (ref 22–32)
Chloride: 101 mmol/L (ref 101–111)
Creatinine, Ser: 1.2 mg/dL — ABNORMAL HIGH (ref 0.44–1.00)
GFR calc Af Amer: 45 mL/min — ABNORMAL LOW (ref >60)
GFR calc non Af Amer: 39 mL/min — ABNORMAL LOW (ref >60)
GLUCOSE: 94 mg/dL (ref 65–99)
Potassium: 4.7 mmol/L (ref 3.5–5.1)
Sodium: 142 mmol/L (ref 135–145)

## 2015-05-31 LAB — TSH: TSH: 5.082 u[IU]/mL — ABNORMAL HIGH (ref 0.350–4.500)

## 2015-05-31 LAB — URINE MICROSCOPIC-ADD ON

## 2015-05-31 MED ORDER — HEPARIN SODIUM (PORCINE) 5000 UNIT/ML IJ SOLN
5000.0000 [IU] | Freq: Three times a day (TID) | INTRAMUSCULAR | Status: DC
  Administered 2015-05-31 – 2015-06-04 (×10): 5000 [IU] via SUBCUTANEOUS
  Filled 2015-05-31 (×10): qty 1

## 2015-05-31 MED ORDER — DOCUSATE SODIUM 100 MG PO CAPS
100.0000 mg | ORAL_CAPSULE | Freq: Two times a day (BID) | ORAL | Status: DC
  Administered 2015-05-31 – 2015-06-03 (×5): 100 mg via ORAL
  Filled 2015-05-31 (×7): qty 1

## 2015-05-31 MED ORDER — PROMETHAZINE HCL 25 MG PO TABS: 12.5000 mg | ORAL_TABLET | Freq: Four times a day (QID) | ORAL | Status: DC | PRN

## 2015-05-31 MED ORDER — ADULT MULTIVITAMIN LIQUID CH
5.0000 mL | Freq: Every day | ORAL | Status: DC
  Administered 2015-05-31 – 2015-06-03 (×3): 5 mL via ORAL
  Filled 2015-05-31 (×5): qty 5

## 2015-05-31 MED ORDER — MELOXICAM 7.5 MG PO TABS
7.5000 mg | ORAL_TABLET | Freq: Every day | ORAL | Status: DC
  Administered 2015-05-31 – 2015-06-02 (×2): 7.5 mg via ORAL
  Filled 2015-05-31 (×3): qty 1

## 2015-05-31 MED ORDER — VITAMIN D3 50 MCG (2000 UT) PO CHEW: 1.0000 | CHEWABLE_TABLET | Freq: Every day | ORAL | Status: DC

## 2015-05-31 MED ORDER — ACETAMINOPHEN 650 MG RE SUPP: 650.0000 mg | Freq: Four times a day (QID) | RECTAL | Status: DC | PRN

## 2015-05-31 MED ORDER — ADULT MULTIVITAMIN W/MINERALS CH: 1.0000 | ORAL_TABLET | Freq: Every day | ORAL | Status: DC

## 2015-05-31 MED ORDER — MORPHINE SULFATE 4 MG/ML IJ SOLN: 4.0000 mg | INTRAMUSCULAR | Status: DC | PRN

## 2015-05-31 MED ORDER — CITALOPRAM HYDROBROMIDE 10 MG PO TABS
10.0000 mg | ORAL_TABLET | Freq: Every day | ORAL | Status: DC
  Administered 2015-05-31 – 2015-06-04 (×4): 10 mg via ORAL
  Filled 2015-05-31 (×4): qty 1

## 2015-05-31 MED ORDER — FLINTSTONES COMPLETE 60 MG PO CHEW: 2.0000 | CHEWABLE_TABLET | Freq: Every day | ORAL | Status: DC

## 2015-05-31 MED ORDER — SODIUM CHLORIDE 0.9 % IV SOLN
1000.0000 mL | INTRAVENOUS | Status: DC
  Administered 2015-05-31: 1000 mL via INTRAVENOUS

## 2015-05-31 MED ORDER — ACETAMINOPHEN 325 MG PO TABS
650.0000 mg | ORAL_TABLET | Freq: Four times a day (QID) | ORAL | Status: DC | PRN
  Administered 2015-05-31: 650 mg via ORAL
  Filled 2015-05-31: qty 2

## 2015-05-31 MED ORDER — DIVALPROEX SODIUM 125 MG PO CSDR
250.0000 mg | DELAYED_RELEASE_CAPSULE | Freq: Three times a day (TID) | ORAL | Status: DC
  Administered 2015-05-31 – 2015-06-04 (×10): 250 mg via ORAL
  Filled 2015-05-31 (×15): qty 2

## 2015-05-31 MED ORDER — MIRTAZAPINE 15 MG PO TABS
7.5000 mg | ORAL_TABLET | Freq: Every day | ORAL | Status: DC
  Administered 2015-05-31 – 2015-06-02 (×3): 7.5 mg via ORAL
  Filled 2015-05-31 (×3): qty 1

## 2015-05-31 MED ORDER — ONDANSETRON HCL 4 MG/2ML IJ SOLN: 4.0000 mg | Freq: Four times a day (QID) | INTRAMUSCULAR | Status: DC | PRN

## 2015-05-31 MED ORDER — MORPHINE SULFATE 2 MG/ML IJ SOLN
1.0000 mg | INTRAMUSCULAR | Status: DC | PRN
  Administered 2015-05-31 – 2015-06-01 (×5): 1 mg via INTRAVENOUS
  Filled 2015-05-31 (×4): qty 1

## 2015-05-31 MED ORDER — VITAMIN D 1000 UNITS PO TABS
2000.0000 [IU] | ORAL_TABLET | Freq: Every day | ORAL | Status: DC
  Administered 2015-05-31 – 2015-06-04 (×4): 2000 [IU] via ORAL
  Filled 2015-05-31 (×4): qty 2

## 2015-05-31 MED ORDER — SODIUM CHLORIDE 0.9 % IV SOLN
1000.0000 mL | INTRAVENOUS | Status: AC
  Administered 2015-05-31 – 2015-06-01 (×2): 1000 mL via INTRAVENOUS

## 2015-05-31 MED ORDER — ALPRAZOLAM 0.5 MG PO TABS
0.5000 mg | ORAL_TABLET | Freq: Three times a day (TID) | ORAL | Status: DC | PRN
  Administered 2015-05-31 – 2015-06-03 (×4): 0.5 mg via ORAL
  Filled 2015-05-31 (×4): qty 1

## 2015-05-31 MED ORDER — ALBUTEROL SULFATE (2.5 MG/3ML) 0.083% IN NEBU: 2.5000 mg | INHALATION_SOLUTION | RESPIRATORY_TRACT | Status: DC | PRN

## 2015-05-31 MED ORDER — PROMETHAZINE HCL 6.25 MG/5ML PO SYRP
12.5000 mg | ORAL_SOLUTION | Freq: Four times a day (QID) | ORAL | Status: DC | PRN
  Filled 2015-05-31: qty 10

## 2015-05-31 NOTE — Consult Note (Signed)
Reason for Consult: acute right hip pain after injury Referring Physician: Jodi Marble is an 81 y.o. female.  HPI: Linea Calles is a 81 y.o. Female with PMH significant for end-stage dementia with behavioral disturbances, resident of [facility]. On 05/31/15 reportedly fell at SNF with acute right hip pain and deformity, could not bear weight. Xrays were performed at nursing facility and patient was found to have an acute right hip fracture. Family and patient deny other musculoskeletal pain, or acute abnormalities. X-ray of the pelvis shows acute fracture of the mid right femoral neck. At baseline, ambulates without assistive device.   Past Medical History  Diagnosis Date  . Vitamin D deficiency   . Insomnia, unspecified   . Other specified cardiac dysrhythmias(427.89)   . Nausea with vomiting   . Dysphagia, oral phase   . Loss of weight   . Hyperpotassemia   . Dizziness and giddiness   . Chronic lymphocytic thyroiditis   . Other and unspecified hyperlipidemia   . Hypothyroidism   . Unspecified protein-calorie malnutrition   . Osteoarthritis, knee   . Senile osteoporosis   . Anxiety and depression   . Breast cancer     "right" (09/22/2013)  . Benign hypertension     "took off BP meds this fall" (09/22/2013)  . Alzheimer's disease     "probably moderate" (09/22/2013)  . Anxiety   . Seizures     "might have had one today; not sure" (09/22/2013    Past Surgical History  Procedure Laterality Date  . Eye surgery    . Knee surgery Right 1940's    "cyst on her knee" (09/22/2013)  . Breast lumpectomy Right 1995    Family History  Problem Relation Age of Onset  . Pneumonia Mother   . Heart attack Father   . Pneumonia Brother     Social History:  reports that she has quit smoking. Her smoking use included Cigarettes. She has a 20 pack-year smoking history. She has never used smokeless tobacco. She reports that she drinks about 4.2 oz of alcohol per  week. She reports that she does not use illicit drugs.  Allergies:  Allergies  Allergen Reactions  . Namenda [Memantine] Other (See Comments)    Chest pains  . Tessalon [Benzonatate] Other (See Comments)    Hallucinations   . Lorazepam Other (See Comments)    hallucinations    Medications: I have reviewed the patient's current medications.  Results for orders placed or performed during the hospital encounter of 05/31/15 (from the past 48 hour(s))  Basic metabolic panel     Status: Abnormal   Collection Time: 05/31/15  9:10 AM  Result Value Ref Range   Sodium 142 135 - 145 mmol/L   Potassium 4.7 3.5 - 5.1 mmol/L   Chloride 101 101 - 111 mmol/L   CO2 31 22 - 32 mmol/L   Glucose, Bld 94 65 - 99 mg/dL   BUN 39 (H) 6 - 20 mg/dL   Creatinine, Ser 1.20 (H) 0.44 - 1.00 mg/dL   Calcium 9.3 8.9 - 10.3 mg/dL   GFR calc non Af Amer 39 (L) >60 mL/min   GFR calc Af Amer 45 (L) >60 mL/min    Comment: (NOTE) The eGFR has been calculated using the CKD EPI equation. This calculation has not been validated in all clinical situations. eGFR's persistently <60 mL/min signify possible Chronic Kidney Disease.    Anion gap 10 5 - 15  CBC WITH DIFFERENTIAL  Status: Abnormal   Collection Time: 05/31/15  9:10 AM  Result Value Ref Range   WBC 6.3 4.0 - 10.5 K/uL   RBC 3.67 (L) 3.87 - 5.11 MIL/uL   Hemoglobin 11.0 (L) 12.0 - 15.0 g/dL   HCT 35.0 (L) 36.0 - 46.0 %   MCV 95.4 78.0 - 100.0 fL   MCH 30.0 26.0 - 34.0 pg   MCHC 31.4 30.0 - 36.0 g/dL   RDW 14.2 11.5 - 15.5 %   Platelets 167 150 - 400 K/uL   Neutrophils Relative % 75 43 - 77 %   Neutro Abs 4.7 1.7 - 7.7 K/uL   Lymphocytes Relative 16 12 - 46 %   Lymphs Abs 1.0 0.7 - 4.0 K/uL   Monocytes Relative 8 3 - 12 %   Monocytes Absolute 0.5 0.1 - 1.0 K/uL   Eosinophils Relative 1 0 - 5 %   Eosinophils Absolute 0.1 0.0 - 0.7 K/uL   Basophils Relative 0 0 - 1 %   Basophils Absolute 0.0 0.0 - 0.1 K/uL  Protime-INR     Status: None    Collection Time: 05/31/15  9:10 AM  Result Value Ref Range   Prothrombin Time 13.9 11.6 - 15.2 seconds   INR 1.05 0.00 - 1.49  Type and screen     Status: None   Collection Time: 05/31/15  9:10 AM  Result Value Ref Range   ABO/RH(D) A NEG    Antibody Screen POS    Sample Expiration 06/03/2015    Antibody Identification ANTI D    DAT, IgG NEG   Urinalysis, Routine w reflex microscopic (not at [facility])     Status: Abnormal   Collection Time: 05/31/15  1:57 PM  Result Value Ref Range   Color, Urine YELLOW YELLOW   APPearance CLEAR CLEAR   Specific Gravity, Urine 1.010 1.005 - 1.030   pH 7.5 5.0 - 8.0   Glucose, UA NEGATIVE NEGATIVE mg/dL   Hgb urine dipstick SMALL (A) NEGATIVE   Bilirubin Urine NEGATIVE NEGATIVE   Ketones, ur NEGATIVE NEGATIVE mg/dL   Protein, ur 30 (A) NEGATIVE mg/dL   Urobilinogen, UA 0.2 0.0 - 1.0 mg/dL   Nitrite NEGATIVE NEGATIVE   Leukocytes, UA NEGATIVE NEGATIVE  Urine microscopic-add on     Status: None   Collection Time: 05/31/15  1:57 PM  Result Value Ref Range   Squamous Epithelial / LPF RARE RARE   RBC / HPF 7-10 <3 RBC/hpf   Urine-Other MUCOUS PRESENT     Dg Chest 1 View  05/31/2015   CLINICAL DATA:  Right hip pain.  Preop right hip ORIF.  EXAM: CHEST  1 VIEW  COMPARISON:  02/03/2014  FINDINGS: The lungs are hyperinflated likely secondary to COPD. There is no focal parenchymal opacity. There is no pleural effusion or pneumothorax. The heart and mediastinal contours are unremarkable.  The osseous structures are unremarkable.  IMPRESSION: No active disease.   Electronically Signed   By: Kathreen Devoid   On: 05/31/2015 09:44   Ct Head Wo Contrast  05/31/2015   CLINICAL DATA:  Pain following fall  EXAM: CT HEAD WITHOUT CONTRAST  CT CERVICAL SPINE WITHOUT CONTRAST  TECHNIQUE: Multidetector CT imaging of the head and cervical spine was performed following the standard protocol without intravenous contrast. Multiplanar CT image reconstructions of the cervical  spine were also generated.  COMPARISON:  Head CT September 22, 2013  FINDINGS: CT HEAD FINDINGS  Motion artifact makes this study somewhat less than optimal.  There is moderate diffuse atrophy, stable. There is no intracranial mass, hemorrhage, extra-axial fluid collection, or midline shift. There is patchy small vessel disease in the centra semiovale bilaterally. There is no acute infarct evident on this study. There is midline frontal scalp hematoma. The bony calvarium appears intact. The mastoid air cells are clear.  CT CERVICAL SPINE FINDINGS  Motion artifact makes this study less than optimal. No fracture or spondylolisthesis is appreciable on this study. Prevertebral soft tissues and predental space regions are normal. There is milder disc space narrowing at C3-4, C4-5, and C5-6. There is moderate facet hypertrophy at essentially all levels bilaterally. No frank disc extrusion or high-grade stenosis appreciable. There is mild scarring in each lung apex.  IMPRESSION: CT head: Study limited due to motion artifact. There is atrophy with periventricular small vessel disease. No acute infarct evident. No hemorrhage, mass, or extra-axial fluid. Midline scalp hematoma without underlying fracture appreciable.  CT cervical spine: Study less than optimal due to motion artifact. Moderate osteoarthritic change at multiple levels. No fracture or spondylolisthesis apparent.   Electronically Signed   By: Lowella Grip III M.D.   On: 05/31/2015 10:01   Ct Cervical Spine Wo Contrast  05/31/2015   CLINICAL DATA:  Pain following fall  EXAM: CT HEAD WITHOUT CONTRAST  CT CERVICAL SPINE WITHOUT CONTRAST  TECHNIQUE: Multidetector CT imaging of the head and cervical spine was performed following the standard protocol without intravenous contrast. Multiplanar CT image reconstructions of the cervical spine were also generated.  COMPARISON:  Head CT September 22, 2013  FINDINGS: CT HEAD FINDINGS  Motion artifact makes this study  somewhat less than optimal. There is moderate diffuse atrophy, stable. There is no intracranial mass, hemorrhage, extra-axial fluid collection, or midline shift. There is patchy small vessel disease in the centra semiovale bilaterally. There is no acute infarct evident on this study. There is midline frontal scalp hematoma. The bony calvarium appears intact. The mastoid air cells are clear.  CT CERVICAL SPINE FINDINGS  Motion artifact makes this study less than optimal. No fracture or spondylolisthesis is appreciable on this study. Prevertebral soft tissues and predental space regions are normal. There is milder disc space narrowing at C3-4, C4-5, and C5-6. There is moderate facet hypertrophy at essentially all levels bilaterally. No frank disc extrusion or high-grade stenosis appreciable. There is mild scarring in each lung apex.  IMPRESSION: CT head: Study limited due to motion artifact. There is atrophy with periventricular small vessel disease. No acute infarct evident. No hemorrhage, mass, or extra-axial fluid. Midline scalp hematoma without underlying fracture appreciable.  CT cervical spine: Study less than optimal due to motion artifact. Moderate osteoarthritic change at multiple levels. No fracture or spondylolisthesis apparent.   Electronically Signed   By: Lowella Grip III M.D.   On: 05/31/2015 10:01   Dg Hip Unilat With Pelvis 2-3 Views Right  05/31/2015   CLINICAL DATA:  Status post fall today. Right hip pain. Initial encounter.  EXAM: DG HIP (WITH OR WITHOUT PELVIS) 2-3V RIGHT  COMPARISON:  None.  FINDINGS: The patient has an acute fracture of the mid aspect of the right femoral neck. The femoral head is located. Mild varus angulation about the fracture is identified. No other acute bony or joint abnormality is seen.  IMPRESSION: Acute fracture mid right femoral neck.   Electronically Signed   By: Inge Rise M.D.   On: 05/31/2015 09:44    Review of Systems  Unable to perform ROS:  dementia  Blood pressure 164/93, pulse 76, temperature 97.7 F (36.5 C), temperature source Oral, resp. rate 16, SpO2 99 %. Physical Exam  Constitutional: She appears well-developed and well-nourished.  HENT:  Head: Normocephalic and atraumatic.  Eyes: EOM are normal.  Neck: Normal range of motion.  Cardiovascular: Intact distal pulses.   Respiratory: Effort normal.  Musculoskeletal:  L LE shortened and externally rotated Pain with ER/IR right hip  Neurological: She is alert.  Skin: Skin is warm and dry.  Psychiatric: Her behavior is normal.    Assessment/Plan: Acute fracture of mid right femoral neck after fall 05/31/15 Discussed with patient and family options for treatment and recommend right hip hemiarthroplasty to reduce pain, improve chance of mobility and decrease morbidity. Risks, benefits and alternatives to the procedure were discussed at length with the patient.  The patient understands potential risk of damage neurovascular structures, bleeding, infection, stiffness and potential failure of the procedure requiring further surgery.   Plan on right hip hemiarthplasty tomorrow 06/01/15 NPO, NWB R UE No anticoagulation under after surgery  Leydi Winstead 05/31/2015, 4:10 PM

## 2015-05-31 NOTE — ED Notes (Signed)
Admitting at bedside 

## 2015-05-31 NOTE — ED Provider Notes (Addendum)
CSN: [id number]     Arrival date & time 05/31/15  0827 History   First MD Initiated Contact with Patient 05/31/15 [phone]     Chief Complaint  Patient presents with  . Fall  . Leg Injury    Level V caveat: dementia  HPI Patient was found on the ground at her nursing facility.  She was found lying on her back with an obvious deformity to her right hip.  Patient has advanced dementia and cannot provide any history of the fall.  Multiple imaging came last night to the facility and confirmed a right femoral neck fracture.  Family states that the patient is otherwise baseline mental status.  Patient was brought to the emergency department immobilized in a c-collar and backboard.  Obvious deformity to her right hip.  No use of anticoagulants.   Past Medical History  Diagnosis Date  . Vitamin D deficiency   . Insomnia, unspecified   . Other specified cardiac dysrhythmias(427.89)   . Nausea with vomiting   . Dysphagia, oral phase   . Loss of weight   . Hyperpotassemia   . Dizziness and giddiness   . Chronic lymphocytic thyroiditis   . Other and unspecified hyperlipidemia   . Hypothyroidism   . Unspecified protein-calorie malnutrition   . Osteoarthritis, knee   . Senile osteoporosis   . Anxiety and depression   . Breast cancer     "right" (09/22/2013)  . Benign hypertension     "took off BP meds this fall" (09/22/2013)  . Alzheimer's disease     "probably moderate" (09/22/2013)  . Anxiety   . Seizures     "might have had one today; not sure" (09/22/2013   Past Surgical History  Procedure Laterality Date  . Eye surgery    . Knee surgery Right 1940's    "cyst on her knee" (09/22/2013)  . Breast lumpectomy Right 1995   Family History  Problem Relation Age of Onset  . Pneumonia Mother   . Heart attack Father   . Pneumonia Brother    Social History  Substance Use Topics  . Smoking status: Former Smoker -- 1.00 packs/day for 20 years    Types: Cigarettes  . Smokeless tobacco: Never  Used     Comment: 09/22/2013 "quite smoking ~ 1960's"  . Alcohol Use: 4.2 oz/week    7 Cans of beer per week     Comment: 09/22/2013 "beer q hs"   OB History    No data available     Review of Systems  All other systems reviewed and are negative.     Allergies  Namenda; Tessalon; and Lorazepam  Home Medications   Prior to Admission medications   Medication Sig Start Date End Date Taking? Authorizing Provider  ALPRAZolam Duanne Moron) 0.5 MG tablet Take 1 tablet (0.5 mg total) by mouth 3 (three) times daily as needed for anxiety. 01/12/14  Yes Tiffany L Reed, DO  Cholecalciferol (VITAMIN D3) 2000 UNITS CHEW Chew 1 tablet by mouth daily.   Yes Historical Provider, MD  citalopram (CELEXA) 10 MG tablet Take 10 mg by mouth daily.   Yes Historical Provider, MD  divalproex (DEPAKOTE SPRINKLE) 125 MG capsule Take 250 mg by mouth 3 (three) times daily.   Yes Historical Provider, MD  flintstones complete (FLINTSTONES) 60 MG chewable tablet Chew 2 tablets by mouth daily.   Yes Historical Provider, MD  ibuprofen (ADVIL,MOTRIN) 400 MG tablet Take 400 mg by mouth every 4 (four) hours as needed for mild pain.  Yes Historical Provider, MD  meloxicam (MOBIC) 7.5 MG tablet Take 7.5 mg by mouth daily.   Yes Historical Provider, MD  mirtazapine (REMERON) 15 MG tablet Take 7.5 mg by mouth at bedtime.   Yes Historical Provider, MD  NON FORMULARY Take 1 each by mouth daily. "herbal life" shakes   Yes Historical Provider, MD  diclofenac sodium (VOLTAREN) 1 % GEL Apply 4 g topically 4 (four) times daily. Patient not taking: Reported on 05/31/2015 10/28/13   Blanchie Serve, MD  donepezil (ARICEPT) 10 MG tablet Take 1 tablet (10 mg total) by mouth at bedtime. For memory Patient not taking: Reported on 05/31/2015 01/26/14   Tiffany L Reed, DO   BP 139/82 mmHg  Pulse 67  Temp(Src) 99 F (37.2 C) (Oral)  Resp 18  SpO2 99% Physical Exam  Constitutional: She appears well-developed and well-nourished. No distress.   HENT:  Head: Normocephalic and atraumatic.  Eyes: EOM are normal.  Neck: Neck supple.  Immobilized in cervical collar.  No obvious cervical spine step-offs but mild cervical tenderness  Cardiovascular: Normal rate, regular rhythm and normal heart sounds.   Pulmonary/Chest: Effort normal and breath sounds normal.  Abdominal: Soft. She exhibits no distension. There is no tenderness.  Musculoskeletal:  Obvious shortening and external rotation of the right lower extremity with pain with range of motion the right hip.  Full range of motion of bilateral wrists, elbows, shoulders.  Normal range of motion of the left ankle, left knee, left hip.  Neurological: She is alert.  Follow simple commands  Skin: Skin is warm and dry.  Psychiatric: She has a normal mood and affect. Judgment normal.  Nursing note and vitals reviewed.   ED Course  Procedures (including critical care time) Labs Review Labs Reviewed  BASIC METABOLIC PANEL - Abnormal; Notable for the following:    BUN 39 (*)    Creatinine, Ser 1.20 (*)    GFR calc non Af Amer 39 (*)    GFR calc Af Amer 45 (*)    All other components within normal limits  CBC WITH DIFFERENTIAL/PLATELET - Abnormal; Notable for the following:    RBC 3.67 (*)    Hemoglobin 11.0 (*)    HCT 35.0 (*)    All other components within normal limits  PROTIME-INR  URINALYSIS, ROUTINE W REFLEX MICROSCOPIC (NOT AT [facility])  TYPE AND SCREEN    Imaging Review Dg Chest 1 View  05/31/2015   CLINICAL DATA:  Right hip pain.  Preop right hip ORIF.  EXAM: CHEST  1 VIEW  COMPARISON:  02/03/2014  FINDINGS: The lungs are hyperinflated likely secondary to COPD. There is no focal parenchymal opacity. There is no pleural effusion or pneumothorax. The heart and mediastinal contours are unremarkable.  The osseous structures are unremarkable.  IMPRESSION: No active disease.   Electronically Signed   By: Kathreen Devoid   On: 05/31/2015 09:44   Ct Head Wo Contrast  05/31/2015    CLINICAL DATA:  Pain following fall  EXAM: CT HEAD WITHOUT CONTRAST  CT CERVICAL SPINE WITHOUT CONTRAST  TECHNIQUE: Multidetector CT imaging of the head and cervical spine was performed following the standard protocol without intravenous contrast. Multiplanar CT image reconstructions of the cervical spine were also generated.  COMPARISON:  Head CT September 22, 2013  FINDINGS: CT HEAD FINDINGS  Motion artifact makes this study somewhat less than optimal. There is moderate diffuse atrophy, stable. There is no intracranial mass, hemorrhage, extra-axial fluid collection, or midline shift. There is patchy  small vessel disease in the centra semiovale bilaterally. There is no acute infarct evident on this study. There is midline frontal scalp hematoma. The bony calvarium appears intact. The mastoid air cells are clear.  CT CERVICAL SPINE FINDINGS  Motion artifact makes this study less than optimal. No fracture or spondylolisthesis is appreciable on this study. Prevertebral soft tissues and predental space regions are normal. There is milder disc space narrowing at C3-4, C4-5, and C5-6. There is moderate facet hypertrophy at essentially all levels bilaterally. No frank disc extrusion or high-grade stenosis appreciable. There is mild scarring in each lung apex.  IMPRESSION: CT head: Study limited due to motion artifact. There is atrophy with periventricular small vessel disease. No acute infarct evident. No hemorrhage, mass, or extra-axial fluid. Midline scalp hematoma without underlying fracture appreciable.  CT cervical spine: Study less than optimal due to motion artifact. Moderate osteoarthritic change at multiple levels. No fracture or spondylolisthesis apparent.   Electronically Signed   By: Lowella Grip III M.D.   On: 05/31/2015 10:01   Ct Cervical Spine Wo Contrast  05/31/2015   CLINICAL DATA:  Pain following fall  EXAM: CT HEAD WITHOUT CONTRAST  CT CERVICAL SPINE WITHOUT CONTRAST  TECHNIQUE: Multidetector CT  imaging of the head and cervical spine was performed following the standard protocol without intravenous contrast. Multiplanar CT image reconstructions of the cervical spine were also generated.  COMPARISON:  Head CT September 22, 2013  FINDINGS: CT HEAD FINDINGS  Motion artifact makes this study somewhat less than optimal. There is moderate diffuse atrophy, stable. There is no intracranial mass, hemorrhage, extra-axial fluid collection, or midline shift. There is patchy small vessel disease in the centra semiovale bilaterally. There is no acute infarct evident on this study. There is midline frontal scalp hematoma. The bony calvarium appears intact. The mastoid air cells are clear.  CT CERVICAL SPINE FINDINGS  Motion artifact makes this study less than optimal. No fracture or spondylolisthesis is appreciable on this study. Prevertebral soft tissues and predental space regions are normal. There is milder disc space narrowing at C3-4, C4-5, and C5-6. There is moderate facet hypertrophy at essentially all levels bilaterally. No frank disc extrusion or high-grade stenosis appreciable. There is mild scarring in each lung apex.  IMPRESSION: CT head: Study limited due to motion artifact. There is atrophy with periventricular small vessel disease. No acute infarct evident. No hemorrhage, mass, or extra-axial fluid. Midline scalp hematoma without underlying fracture appreciable.  CT cervical spine: Study less than optimal due to motion artifact. Moderate osteoarthritic change at multiple levels. No fracture or spondylolisthesis apparent.   Electronically Signed   By: Lowella Grip III M.D.   On: 05/31/2015 10:01   Dg Hip Unilat With Pelvis 2-3 Views Right  05/31/2015   CLINICAL DATA:  Status post fall today. Right hip pain. Initial encounter.  EXAM: DG HIP (WITH OR WITHOUT PELVIS) 2-3V RIGHT  COMPARISON:  None.  FINDINGS: The patient has an acute fracture of the mid aspect of the right femoral neck. The femoral head is  located. Mild varus angulation about the fracture is identified. No other acute bony or joint abnormality is seen.  IMPRESSION: Acute fracture mid right femoral neck.   Electronically Signed   By: Inge Rise M.D.   On: 05/31/2015 09:44   I, Trenia Tennyson M, personally reviewed and evaluated these images and lab results as part of my medical decision-making.   EKG Interpretation   Date/Time:  Monday May 31 2015 09:08:34 EDT Ventricular  Rate:  67 PR Interval:  138 QRS Duration: 75 QT Interval:  412 QTC Calculation: 435 R Axis:   74 Text Interpretation:  Sinus rhythm Baseline wander in lead(s) V5 No  significant change was found Confirmed by Briarrose Shor  MD, Tylor Gambrill ([id number]) on  05/31/2015 9:32:00 AM      MDM   Final diagnoses:  Closed right hip fracture, initial encounter    Orthopedics: Dr Tamera Punt and [facility]. Will plan on operative repair likely tomorrow. They will see later today in the hospital  Admit to hospitalist for right femoral neck fracture.  No anticoagulants.  CT head C-spine without acute pathology.  Nothing by mouth.  Patient family updated    Jola Schmidt, MD 05/31/15 [place], MD 05/31/15 1017

## 2015-05-31 NOTE — ED Notes (Signed)
Patient transported to CT 

## 2015-05-31 NOTE — H&P (Signed)
History and Physical  Theresa Rodriguez XKG:[id number] DOB: 30-Jan-1922 DOA: 05/31/2015  Referring physician: Dr. Jola Schmidt, EDP PCP: Woody Seller, MD  Outpatient Specialists:  1. Not known  Chief Complaint: Right hip pain following fall at SNF.  HPI: Theresa Rodriguez is a 81 y.o. female , widowed, resident of [facility] SNF, ambulates independently, PMH significant for end-stage dementia with behavioral disturbances, vitamin D deficiency, HLD, hypothyroid, osteoporosis, anxiety & depression, HTN, DO NOT RESUSCITATE, was transferred from SNF to [facility] ED on 05/31/15 with complaints of right hip pain following a fall at SNF. Patient is unable to provide any history secondary to her advanced dementia. History is obtained from patient's 2 daughters at bedside. At baseline, patient has end-stage dementia and she actively ambulates in the nursing facility without support. Family takes her out of SNF frequently for family outings. On 05/30/15, her daughter took her out during one such a family outing and she returned to nursing facility at approximately 4:30 PM. Patient went into an other clients room and laid down on the bed. A short while later, nursing home staff noticed that patient was laying on the floor with deformity of her right lower extremity and she was in some degree of pain. This was an unwitnessed fall and no report regarding LOC or head and neck injury. X-rays were performed at nursing facility and patient was found to have an acute right hip fracture. M.D. evaluated and patient was treated symptomatically at SNF overnight and was transferred to the hospital this morning. In the ED, lab work significant for creatinine of 1.2, hemoglobin 11, chest x-ray negative, CT head and neck significant for midline frontal scalp hematoma but otherwise no acute abnormalities and x-ray of the pelvis shows acute fracture of the mid right femoral neck. Orthopedics has been consulted by EDP who  planned for surgical intervention on 8/16. Hospitalist admission was requested. As per family, patient does not eat or drink well which has been ongoing for the last 3 months.   Review of Systems: All systems reviewed and apart from history of presenting illness, are negative.  Past Medical History  Diagnosis Date  . Vitamin D deficiency   . Insomnia, unspecified   . Other specified cardiac dysrhythmias(427.89)   . Nausea with vomiting   . Dysphagia, oral phase   . Loss of weight   . Hyperpotassemia   . Dizziness and giddiness   . Chronic lymphocytic thyroiditis   . Other and unspecified hyperlipidemia   . Hypothyroidism   . Unspecified protein-calorie malnutrition   . Osteoarthritis, knee   . Senile osteoporosis   . Anxiety and depression   . Breast cancer     "right" (09/22/2013)  . Benign hypertension     "took off BP meds this fall" (09/22/2013)  . Alzheimer's disease     "probably moderate" (09/22/2013)  . Anxiety   . Seizures     "might have had one today; not sure" (09/22/2013   Past Surgical History  Procedure Laterality Date  . Eye surgery    . Knee surgery Right 1940's    "cyst on her knee" (09/22/2013)  . Breast lumpectomy Right 1995   Social History:  reports that she has quit smoking. Her smoking use included Cigarettes. She has a 20 pack-year smoking history. She has never used smokeless tobacco. She reports that she drinks about 4.2 oz of alcohol per week. She reports that she does not use illicit drugs.  As per family, patient drinks a  beer on most nights of the week. No history of liquor abuse.   Allergies  Allergen Reactions  . Namenda [Memantine] Other (See Comments)    Chest pains  . Tessalon [Benzonatate] Other (See Comments)    Hallucinations   . Lorazepam Other (See Comments)    hallucinations    Family History  Problem Relation Age of Onset  . Pneumonia Mother   . Heart attack Father   . Pneumonia Brother     Prior to Admission  medications   Medication Sig Start Date End Date Taking? Authorizing Provider  ALPRAZolam Duanne Moron) 0.5 MG tablet Take 1 tablet (0.5 mg total) by mouth 3 (three) times daily as needed for anxiety. 01/12/14  Yes Tiffany L Reed, DO  Cholecalciferol (VITAMIN D3) 2000 UNITS CHEW Chew 1 tablet by mouth daily.   Yes Historical Provider, MD  citalopram (CELEXA) 10 MG tablet Take 10 mg by mouth daily.   Yes Historical Provider, MD  divalproex (DEPAKOTE SPRINKLE) 125 MG capsule Take 250 mg by mouth 3 (three) times daily.   Yes Historical Provider, MD  flintstones complete (FLINTSTONES) 60 MG chewable tablet Chew 2 tablets by mouth daily.   Yes Historical Provider, MD  ibuprofen (ADVIL,MOTRIN) 400 MG tablet Take 400 mg by mouth every 4 (four) hours as needed for mild pain.   Yes Historical Provider, MD  meloxicam (MOBIC) 7.5 MG tablet Take 7.5 mg by mouth daily.   Yes Historical Provider, MD  mirtazapine (REMERON) 15 MG tablet Take 7.5 mg by mouth at bedtime.   Yes Historical Provider, MD  NON FORMULARY Take 1 each by mouth daily. "herbal life" shakes   Yes Historical Provider, MD   Physical Exam: Filed Vitals:   05/31/15 0845 05/31/15 0900 05/31/15 1000 05/31/15 1015  BP: 150/82 144/73 128/64 144/70  Pulse: 70 65 65 65  Temp:      TempSrc:      Resp:      SpO2: 100% 97% 99% 97%   temperature 63F and respiratory rate 18/m.   General exam: Moderately built and nourished pleasant elderly female patient, lying comfortably supine on the gurney in no obvious distress.  Head, eyes and ENT: Nontraumatic and normocephalic. Pupils equally reacting to light and accommodation. Oral mucosa dry.  Neck: Supple. No JVD, carotid bruit or thyromegaly.  Lymphatics: No lymphadenopathy.  Respiratory system: Clear to auscultation/poor inspiratory effort. No increased work of breathing.  Cardiovascular system: S1 and S2 heard, RRR. No JVD, murmurs, gallops, clicks or pedal edema.  Gastrointestinal system: Abdomen  is nondistended, soft and nontender. Normal bowel sounds heard. No organomegaly or masses appreciated.  Central nervous system: Alert but not oriented and does not follow instructions. No focal neurological deficits.  Extremities: Spontaneously moves both upper extremities and left lower extremity. Right lower extremity movements restricted due to pain. Right lower extremity shortened and internally rotated. Peripheral pulses symmetrically felt.   Skin: No rashes or acute findings.  Musculoskeletal system: Negative exam.  Psychiatry: Pleasant and cooperative.   Labs on Admission:  Basic Metabolic Panel:  Recent Labs Lab 05/31/15 0910  NA 142  K 4.7  CL 101  CO2 31  GLUCOSE 94  BUN 39*  CREATININE 1.20*  CALCIUM 9.3   Liver Function Tests: No results for input(s): AST, ALT, ALKPHOS, BILITOT, PROT, ALBUMIN in the last 168 hours. No results for input(s): LIPASE, AMYLASE in the last 168 hours. No results for input(s): AMMONIA in the last 168 hours. CBC:  Recent Labs Lab 05/31/15 [id number]  WBC 6.3  NEUTROABS 4.7  HGB 11.0*  HCT 35.0*  MCV 95.4  PLT 167   Cardiac Enzymes: No results for input(s): CKTOTAL, CKMB, CKMBINDEX, TROPONINI in the last 168 hours.  BNP (last 3 results) No results for input(s): PROBNP in the last 8760 hours. CBG: No results for input(s): GLUCAP in the last 168 hours.  Radiological Exams on Admission: Dg Chest 1 View  05/31/2015   CLINICAL DATA:  Right hip pain.  Preop right hip ORIF.  EXAM: CHEST  1 VIEW  COMPARISON:  02/03/2014  FINDINGS: The lungs are hyperinflated likely secondary to COPD. There is no focal parenchymal opacity. There is no pleural effusion or pneumothorax. The heart and mediastinal contours are unremarkable.  The osseous structures are unremarkable.  IMPRESSION: No active disease.   Electronically Signed   By: Kathreen Devoid   On: 05/31/2015 09:44   Ct Head Wo Contrast  05/31/2015   CLINICAL DATA:  Pain following fall  EXAM: CT  HEAD WITHOUT CONTRAST  CT CERVICAL SPINE WITHOUT CONTRAST  TECHNIQUE: Multidetector CT imaging of the head and cervical spine was performed following the standard protocol without intravenous contrast. Multiplanar CT image reconstructions of the cervical spine were also generated.  COMPARISON:  Head CT September 22, 2013  FINDINGS: CT HEAD FINDINGS  Motion artifact makes this study somewhat less than optimal. There is moderate diffuse atrophy, stable. There is no intracranial mass, hemorrhage, extra-axial fluid collection, or midline shift. There is patchy small vessel disease in the centra semiovale bilaterally. There is no acute infarct evident on this study. There is midline frontal scalp hematoma. The bony calvarium appears intact. The mastoid air cells are clear.  CT CERVICAL SPINE FINDINGS  Motion artifact makes this study less than optimal. No fracture or spondylolisthesis is appreciable on this study. Prevertebral soft tissues and predental space regions are normal. There is milder disc space narrowing at C3-4, C4-5, and C5-6. There is moderate facet hypertrophy at essentially all levels bilaterally. No frank disc extrusion or high-grade stenosis appreciable. There is mild scarring in each lung apex.  IMPRESSION: CT head: Study limited due to motion artifact. There is atrophy with periventricular small vessel disease. No acute infarct evident. No hemorrhage, mass, or extra-axial fluid. Midline scalp hematoma without underlying fracture appreciable.  CT cervical spine: Study less than optimal due to motion artifact. Moderate osteoarthritic change at multiple levels. No fracture or spondylolisthesis apparent.   Electronically Signed   By: Lowella Grip III M.D.   On: 05/31/2015 10:01   Ct Cervical Spine Wo Contrast  05/31/2015   CLINICAL DATA:  Pain following fall  EXAM: CT HEAD WITHOUT CONTRAST  CT CERVICAL SPINE WITHOUT CONTRAST  TECHNIQUE: Multidetector CT imaging of the head and cervical spine was  performed following the standard protocol without intravenous contrast. Multiplanar CT image reconstructions of the cervical spine were also generated.  COMPARISON:  Head CT September 22, 2013  FINDINGS: CT HEAD FINDINGS  Motion artifact makes this study somewhat less than optimal. There is moderate diffuse atrophy, stable. There is no intracranial mass, hemorrhage, extra-axial fluid collection, or midline shift. There is patchy small vessel disease in the centra semiovale bilaterally. There is no acute infarct evident on this study. There is midline frontal scalp hematoma. The bony calvarium appears intact. The mastoid air cells are clear.  CT CERVICAL SPINE FINDINGS  Motion artifact makes this study less than optimal. No fracture or spondylolisthesis is appreciable on this study. Prevertebral soft tissues and predental space  regions are normal. There is milder disc space narrowing at C3-4, C4-5, and C5-6. There is moderate facet hypertrophy at essentially all levels bilaterally. No frank disc extrusion or high-grade stenosis appreciable. There is mild scarring in each lung apex.  IMPRESSION: CT head: Study limited due to motion artifact. There is atrophy with periventricular small vessel disease. No acute infarct evident. No hemorrhage, mass, or extra-axial fluid. Midline scalp hematoma without underlying fracture appreciable.  CT cervical spine: Study less than optimal due to motion artifact. Moderate osteoarthritic change at multiple levels. No fracture or spondylolisthesis apparent.   Electronically Signed   By: Lowella Grip III M.D.   On: 05/31/2015 10:01   Dg Hip Unilat With Pelvis 2-3 Views Right  05/31/2015   CLINICAL DATA:  Status post fall today. Right hip pain. Initial encounter.  EXAM: DG HIP (WITH OR WITHOUT PELVIS) 2-3V RIGHT  COMPARISON:  None.  FINDINGS: The patient has an acute fracture of the mid aspect of the right femoral neck. The femoral head is located. Mild varus angulation about the  fracture is identified. No other acute bony or joint abnormality is seen.  IMPRESSION: Acute fracture mid right femoral neck.   Electronically Signed   By: Inge Rise M.D.   On: 05/31/2015 09:44    EKG: Independently reviewed. Sinus rhythm, normal axis and no acute changes. QTC 435 ms.  Assessment/Plan Principal Problem:   Closed right hip fracture Active Problems:   Anxiety and depression   Hypothyroidism   Alzheimer's dementia with behavioral disturbance   Panic attacks   Essential hypertension, benign   CKD (chronic kidney disease), stage III   Anemia   1. Closed acute right mid femoral neck/hip fracture: Sustained status post fall at SNF. Orthopedics has been consulted by EDP and plan for surgical fixation on 06/01/15. Based on available data, patient is at low risk for perioperative cardiac events and may proceed with indicated surgery under close perioperative monitoring & without any further cardiac workup. Will admit to medical floor. Foley catheter placed after discussing with family. Postoperative weightbearing/activity status, pain management, DVT prophylaxis and wound care as per orthopedic service. 2. End-stage dementia with behavioral disturbances: Mental status at baseline. Continue home medications. 3. Dehydration: Secondary to poor oral intake. Brief IV fluid hydration. 4. Stage III chronic kidney disease: Creatinine on admission 1.2. In March creatinine was 0.9. Brief IV fluids and follow creatinine in a.m. 5. Essential hypertension: As per family, meds were discontinued and blood pressures have been controlled off medications. Monitor 6. Anxiety & depression, panic attacks: Continue home medications. 7. Hypothyroid: Not on thyroid supplements PTA. Will request TSH. 8. Anemia: Follow CBCs   DVT prophylaxis: Heparin subcutaneously (preoperatively) Code Status: DO NOT RESUSCITATE: This was her status at SNF. Confirmed with daughter/healthcare power of attorney Ms.  Herbie Drape and her sister at bedside. Family Communication: Discussed with patient's 2 daughters at bedside.  Disposition Plan: Return to SNF when medically stable, possibly in the next 3-4 days.   Time spent: 65 minutes.  Vernell Leep, MD, FACP, FHM. [facility] Pager [phone]  If 7PM-7AM, please contact night-coverage [email or web address] Password [facility] 05/31/2015, 10:54 AM

## 2015-05-31 NOTE — ED Notes (Signed)
Pt arrived by [facility] for a fall that occurred yesterday around 4:30. Pt resides at [facility], they had mobile imaging come last night for xrays. +right femoral neck fracture, pt received fentanyl 38mcg pta.

## 2015-06-01 ENCOUNTER — Inpatient Hospital Stay (HOSPITAL_COMMUNITY): Payer: Medicare Other | Admitting: Anesthesiology

## 2015-06-01 ENCOUNTER — Inpatient Hospital Stay (HOSPITAL_COMMUNITY): Payer: Medicare Other

## 2015-06-01 ENCOUNTER — Encounter (HOSPITAL_COMMUNITY)
Admission: EM | Disposition: A | Discharge status: Skilled Nursing Facility | Payer: Self-pay | Source: Home / Self Care | Attending: Internal Medicine

## 2015-06-01 ENCOUNTER — Encounter (HOSPITAL_COMMUNITY): Payer: Self-pay | Admitting: *Deleted

## 2015-06-01 DIAGNOSIS — F418 Other specified anxiety disorders: Secondary | ICD-10-CM

## 2015-06-01 DIAGNOSIS — E039 Hypothyroidism, unspecified: Secondary | ICD-10-CM

## 2015-06-01 DIAGNOSIS — I10 Essential (primary) hypertension: Secondary | ICD-10-CM

## 2015-06-01 DIAGNOSIS — S72001D Fracture of unspecified part of neck of right femur, subsequent encounter for closed fracture with routine healing: Secondary | ICD-10-CM

## 2015-06-01 HISTORY — PX: HIP ARTHROPLASTY: SHX981

## 2015-06-01 LAB — BASIC METABOLIC PANEL
ANION GAP: 7 (ref 5–15)
BUN: 17 mg/dL (ref 6–20)
CO2: 31 mmol/L (ref 22–32)
Calcium: 8.6 mg/dL — ABNORMAL LOW (ref 8.9–10.3)
Chloride: 99 mmol/L — ABNORMAL LOW (ref 101–111)
Creatinine, Ser: 0.91 mg/dL (ref 0.44–1.00)
GFR calc Af Amer: >60 mL/min (ref >60)
GFR, EST NON AFRICAN AMERICAN: 54 mL/min — AB (ref >60)
GLUCOSE: 104 mg/dL — AB (ref 65–99)
POTASSIUM: 4.2 mmol/L (ref 3.5–5.1)
SODIUM: 137 mmol/L (ref 135–145)

## 2015-06-01 LAB — MRSA PCR SCREENING: MRSA by PCR: NEGATIVE

## 2015-06-01 LAB — CBC
HEMATOCRIT: 33.4 % — AB (ref 36.0–46.0)
HEMOGLOBIN: 10.4 g/dL — AB (ref 12.0–15.0)
MCH: 29.7 pg (ref 26.0–34.0)
MCHC: 31.1 g/dL (ref 30.0–36.0)
MCV: 95.4 fL (ref 78.0–100.0)
Platelets: 168 10*3/uL (ref 150–400)
RBC: 3.5 MIL/uL — ABNORMAL LOW (ref 3.87–5.11)
RDW: 14.1 % (ref 11.5–15.5)
WBC: 8.4 10*3/uL (ref 4.0–10.5)

## 2015-06-01 SURGERY — HEMIARTHROPLASTY, HIP, DIRECT ANTERIOR APPROACH, FOR FRACTURE
Anesthesia: General | Site: Hip | Laterality: Right

## 2015-06-01 MED ORDER — PROPOFOL 10 MG/ML IV BOLUS
INTRAVENOUS | Status: DC | PRN
  Administered 2015-06-01: 20 mg via INTRAVENOUS

## 2015-06-01 MED ORDER — SODIUM CHLORIDE 0.9 % IV SOLN: 6.2500 mg | Freq: Once | INTRAVENOUS | Status: DC

## 2015-06-01 MED ORDER — HYDROCODONE-ACETAMINOPHEN 5-325 MG PO TABS: 1.0000 | ORAL_TABLET | Freq: Four times a day (QID) | ORAL | Status: DC | PRN

## 2015-06-01 MED ORDER — PHENOL 1.4 % MT LIQD: 1.0000 | OROMUCOSAL | Status: DC | PRN

## 2015-06-01 MED ORDER — MEPERIDINE HCL 25 MG/ML IJ SOLN
INTRAMUSCULAR | Status: AC
  Filled 2015-06-01: qty 1

## 2015-06-01 MED ORDER — FENTANYL CITRATE (PF) 250 MCG/5ML IJ SOLN
INTRAMUSCULAR | Status: AC
Start: 2015-06-01 — End: 2015-06-01
  Filled 2015-06-01: qty 5

## 2015-06-01 MED ORDER — MIDAZOLAM HCL 2 MG/2ML IJ SOLN
INTRAMUSCULAR | Status: AC
  Filled 2015-06-01: qty 4

## 2015-06-01 MED ORDER — ONDANSETRON HCL 4 MG PO TABS: 4.0000 mg | ORAL_TABLET | Freq: Four times a day (QID) | ORAL | Status: DC | PRN

## 2015-06-01 MED ORDER — PHENYLEPHRINE HCL 10 MG/ML IJ SOLN
INTRAMUSCULAR | Status: DC | PRN
  Administered 2015-06-01: 40 ug via INTRAVENOUS

## 2015-06-01 MED ORDER — MORPHINE SULFATE (PF) 2 MG/ML IV SOLN: 0.5000 mg | INTRAVENOUS | Status: DC | PRN

## 2015-06-01 MED ORDER — ACETAMINOPHEN 325 MG PO TABS
650.0000 mg | ORAL_TABLET | Freq: Four times a day (QID) | ORAL | Status: DC | PRN
  Administered 2015-06-02 – 2015-06-03 (×4): 650 mg via ORAL
  Filled 2015-06-01 (×4): qty 2

## 2015-06-01 MED ORDER — MEPERIDINE HCL 25 MG/ML IJ SOLN
6.2500 mg | Freq: Once | INTRAMUSCULAR | Status: AC
  Administered 2015-06-01: 6.25 mg via INTRAVENOUS

## 2015-06-01 MED ORDER — PROPOFOL 10 MG/ML IV BOLUS
INTRAVENOUS | Status: AC
  Filled 2015-06-01: qty 20

## 2015-06-01 MED ORDER — ONDANSETRON HCL 4 MG/2ML IJ SOLN: 4.0000 mg | Freq: Four times a day (QID) | INTRAMUSCULAR | Status: DC | PRN

## 2015-06-01 MED ORDER — ALBUMIN HUMAN 5 % IV SOLN
INTRAVENOUS | Status: DC | PRN
Start: 2015-06-01 — End: 2015-06-01
  Administered 2015-06-01: 12:00:00 via INTRAVENOUS

## 2015-06-01 MED ORDER — FENTANYL CITRATE (PF) 100 MCG/2ML IJ SOLN
INTRAMUSCULAR | Status: DC | PRN
  Administered 2015-06-01: 50 ug via INTRAVENOUS
  Administered 2015-06-01: 25 ug via INTRAVENOUS
  Administered 2015-06-01: 50 ug via INTRAVENOUS
  Administered 2015-06-01: 25 ug via INTRAVENOUS

## 2015-06-01 MED ORDER — METOCLOPRAMIDE HCL 5 MG PO TABS: 5.0000 mg | ORAL_TABLET | Freq: Three times a day (TID) | ORAL | Status: DC | PRN

## 2015-06-01 MED ORDER — OXYCODONE HCL 5 MG PO TABS
5.0000 mg | ORAL_TABLET | ORAL | Status: DC | PRN
  Administered 2015-06-02: 5 mg via ORAL
  Filled 2015-06-01: qty 1

## 2015-06-01 MED ORDER — LEVOTHYROXINE SODIUM 25 MCG PO TABS: 25.0000 ug | ORAL_TABLET | Freq: Every day | ORAL | Status: DC

## 2015-06-01 MED ORDER — CHLORHEXIDINE GLUCONATE 4 % EX LIQD
60.0000 mL | Freq: Once | CUTANEOUS | Status: DC
  Filled 2015-06-01: qty 60

## 2015-06-01 MED ORDER — SODIUM CHLORIDE 0.9 % IR SOLN
Status: DC | PRN
  Administered 2015-06-01: 3000 mL

## 2015-06-01 MED ORDER — MENTHOL 3 MG MT LOZG: 1.0000 | LOZENGE | OROMUCOSAL | Status: DC | PRN

## 2015-06-01 MED ORDER — CEFAZOLIN SODIUM-DEXTROSE 2-3 GM-% IV SOLR
2.0000 g | INTRAVENOUS | Status: AC
  Administered 2015-06-01: 2 g via INTRAVENOUS
  Filled 2015-06-01: qty 50

## 2015-06-01 MED ORDER — ROCURONIUM BROMIDE 100 MG/10ML IV SOLN
INTRAVENOUS | Status: DC | PRN
  Administered 2015-06-01: 20 mg via INTRAVENOUS
  Administered 2015-06-01: 10 mg via INTRAVENOUS

## 2015-06-01 MED ORDER — ASPIRIN EC 325 MG PO TBEC
325.0000 mg | DELAYED_RELEASE_TABLET | Freq: Two times a day (BID) | ORAL | Status: DC
  Administered 2015-06-01 – 2015-06-04 (×5): 325 mg via ORAL
  Filled 2015-06-01 (×6): qty 1

## 2015-06-01 MED ORDER — GLYCOPYRROLATE 0.2 MG/ML IJ SOLN
INTRAMUSCULAR | Status: DC | PRN
  Administered 2015-06-01: 0.4 mg via INTRAVENOUS

## 2015-06-01 MED ORDER — ESMOLOL HCL 10 MG/ML IV SOLN
INTRAVENOUS | Status: DC | PRN
  Administered 2015-06-01: 10 mg via INTRAVENOUS
  Administered 2015-06-01: 20 mg via INTRAVENOUS

## 2015-06-01 MED ORDER — FENTANYL CITRATE (PF) 100 MCG/2ML IJ SOLN: 25.0000 ug | INTRAMUSCULAR | Status: DC | PRN

## 2015-06-01 MED ORDER — LACTATED RINGERS IV SOLN
INTRAVENOUS | Status: DC
  Administered 2015-06-01: 50 mL/h via INTRAVENOUS
  Administered 2015-06-01 – 2015-06-02 (×2): via INTRAVENOUS

## 2015-06-01 MED ORDER — 0.9 % SODIUM CHLORIDE (POUR BTL) OPTIME
TOPICAL | Status: DC | PRN
  Administered 2015-06-01: 1000 mL

## 2015-06-01 MED ORDER — ACETAMINOPHEN 650 MG RE SUPP: 650.0000 mg | Freq: Four times a day (QID) | RECTAL | Status: DC | PRN

## 2015-06-01 MED ORDER — METOCLOPRAMIDE HCL 5 MG/ML IJ SOLN: 5.0000 mg | Freq: Three times a day (TID) | INTRAMUSCULAR | Status: DC | PRN

## 2015-06-01 MED ORDER — ONDANSETRON HCL 4 MG/2ML IJ SOLN
INTRAMUSCULAR | Status: DC | PRN
  Administered 2015-06-01: 4 mg via INTRAVENOUS

## 2015-06-01 MED ORDER — MEPERIDINE HCL 50 MG/5ML PO SOLN: 6.2500 mg | Freq: Once | ORAL | Status: DC

## 2015-06-01 MED ORDER — CEFAZOLIN SODIUM-DEXTROSE 2-3 GM-% IV SOLR
2.0000 g | Freq: Four times a day (QID) | INTRAVENOUS | Status: AC
  Administered 2015-06-01 (×2): 2 g via INTRAVENOUS
  Filled 2015-06-01 (×2): qty 50

## 2015-06-01 MED ORDER — NEOSTIGMINE METHYLSULFATE 10 MG/10ML IV SOLN
INTRAVENOUS | Status: DC | PRN
Start: 2015-06-01 — End: 2015-06-01
  Administered 2015-06-01: 2 mg via INTRAVENOUS

## 2015-06-01 MED ORDER — LIDOCAINE HCL (CARDIAC) 20 MG/ML IV SOLN
INTRAVENOUS | Status: DC | PRN
  Administered 2015-06-01: 50 mg via INTRAVENOUS

## 2015-06-01 MED ORDER — LACTATED RINGERS IV SOLN
INTRAVENOUS | Status: DC | PRN
  Administered 2015-06-01 (×2): via INTRAVENOUS

## 2015-06-01 SURGICAL SUPPLY — 58 items
BLADE SAW SAG 73X25 THK (BLADE) ×2
BLADE SAW SGTL 73X25 THK (BLADE) ×1 IMPLANT
BLADE SURG ROTATE 9660 (MISCELLANEOUS) IMPLANT
BRUSH FEMORAL CANAL (MISCELLANEOUS) ×3 IMPLANT
CAPT HIP HEMI 1 ×3 IMPLANT
CEMENT BONE DEPUY (Cement) ×6 IMPLANT
CEMENT RESTRICTOR DEPUY SZ 4 (Cement) ×6 IMPLANT
CHLORAPREP W/TINT 26ML (MISCELLANEOUS) ×3 IMPLANT
COVER SURGICAL LIGHT HANDLE (MISCELLANEOUS) ×3 IMPLANT
DRAPE IMP U-DRAPE 54X76 (DRAPES) ×3 IMPLANT
DRAPE INCISE IOBAN 66X45 STRL (DRAPES) IMPLANT
DRAPE ORTHO SPLIT 77X108 STRL (DRAPES) ×4
DRAPE PROXIMA HALF (DRAPES) ×3 IMPLANT
DRAPE SURG ORHT 6 SPLT 77X108 (DRAPES) ×2 IMPLANT
DRAPE U-SHAPE 47X51 STRL (DRAPES) ×3 IMPLANT
DRILL BIT 7/64X5 (BIT) ×3 IMPLANT
DRSG AQUACEL AG ADV 3.5X10 (GAUZE/BANDAGES/DRESSINGS) ×3 IMPLANT
ELECT BLADE 6.5 EXT (BLADE) IMPLANT
ELECT REM PT RETURN 9FT ADLT (ELECTROSURGICAL) ×3
ELECTRODE REM PT RTRN 9FT ADLT (ELECTROSURGICAL) ×1 IMPLANT
EVACUATOR 1/8 PVC DRAIN (DRAIN) IMPLANT
GLOVE BIO SURGEON STRL SZ7 (GLOVE) ×3 IMPLANT
GLOVE BIO SURGEON STRL SZ7.5 (GLOVE) ×3 IMPLANT
GLOVE BIO SURGEON STRL SZ8.5 (GLOVE) ×3 IMPLANT
GLOVE BIOGEL PI IND STRL 8 (GLOVE) ×1 IMPLANT
GLOVE BIOGEL PI INDICATOR 8 (GLOVE) ×2
GLOVE SS BIOGEL STRL SZ 8 (GLOVE) ×1 IMPLANT
GLOVE SUPERSENSE BIOGEL SZ 8 (GLOVE) ×2
GLOVE SURG SS PI 6.5 STRL IVOR (GLOVE) ×3 IMPLANT
GOWN STRL REUS W/ TWL LRG LVL3 (GOWN DISPOSABLE) ×3 IMPLANT
GOWN STRL REUS W/TWL LRG LVL3 (GOWN DISPOSABLE) ×6
HANDPIECE INTERPULSE COAX TIP (DISPOSABLE)
HOOD PEEL AWAY FACE SHEILD DIS (HOOD) ×6 IMPLANT
IMMOBILIZER KNEE 22 UNIV (SOFTGOODS) ×3 IMPLANT
KIT BASIN OR (CUSTOM PROCEDURE TRAY) ×3 IMPLANT
KIT ROOM TURNOVER OR (KITS) ×3 IMPLANT
MANIFOLD NEPTUNE II (INSTRUMENTS) ×3 IMPLANT
NEEDLE MAYO TROCAR (NEEDLE) ×3 IMPLANT
NS IRRIG 1000ML POUR BTL (IV SOLUTION) ×3 IMPLANT
PACK TOTAL JOINT (CUSTOM PROCEDURE TRAY) ×3 IMPLANT
PACK UNIVERSAL I (CUSTOM PROCEDURE TRAY) ×3 IMPLANT
PAD ARMBOARD 7.5X6 YLW CONV (MISCELLANEOUS) ×6 IMPLANT
PASSER SUT SWANSON 36MM LOOP (INSTRUMENTS) IMPLANT
PRESSURIZER FEMORAL UNIV (MISCELLANEOUS) ×3 IMPLANT
SET HNDPC FAN SPRY TIP SCT (DISPOSABLE) IMPLANT
STAPLER VISISTAT 35W (STAPLE) ×3 IMPLANT
STEM DIST FEM CENTRALIZR 13 (Hips) ×3 IMPLANT
SUT ETHIBOND NAB CT1 #1 30IN (SUTURE) ×12 IMPLANT
SUT VIC AB 0 CT1 27 (SUTURE) ×2
SUT VIC AB 0 CT1 27XBRD ANBCTR (SUTURE) ×1 IMPLANT
SUT VIC AB 1 CTB1 27 (SUTURE) ×6 IMPLANT
SUT VIC AB 2-0 CT1 27 (SUTURE) ×4
SUT VIC AB 2-0 CT1 TAPERPNT 27 (SUTURE) ×2 IMPLANT
TOWEL OR 17X24 6PK STRL BLUE (TOWEL DISPOSABLE) ×3 IMPLANT
TOWEL OR 17X26 10 PK STRL BLUE (TOWEL DISPOSABLE) ×3 IMPLANT
TOWER CARTRIDGE SMART MIX (DISPOSABLE) IMPLANT
TRAY FOLEY CATH 16FRSI W/METER (SET/KITS/TRAYS/PACK) IMPLANT
WATER STERILE IRR 1000ML POUR (IV SOLUTION) ×12 IMPLANT

## 2015-06-01 NOTE — Interval H&P Note (Signed)
History and Physical Interval Note:  06/01/2015 10:33 AM  Theresa Rodriguez  has presented today for surgery, with the diagnosis of Femur Neck Fracture  The various methods of treatment have been discussed with the patient and family. After consideration of risks, benefits and other options for treatment, the patient has consented to  Procedure(s): ARTHROPLASTY BIPOLAR HIP (HEMIARTHROPLASTY) for Femur Fracture (Right) as a surgical intervention .  The patient's history has been reviewed, patient examined, no change in status, stable for surgery.  I have reviewed the patient's chart and labs.  Questions were answered to the patient's satisfaction.     Nita Sells

## 2015-06-01 NOTE — Anesthesia Procedure Notes (Signed)
Procedure Name: Intubation Date/Time: 06/01/2015 11:19 AM Performed by: Neldon Newport Pre-anesthesia Checklist: Patient being monitored, Suction available, Emergency Drugs available, Patient identified and Timeout performed Patient Re-evaluated:Patient Re-evaluated prior to inductionOxygen Delivery Method: Circle system utilized Preoxygenation: Pre-oxygenation with 100% oxygen Intubation Type: IV induction Ventilation: Mask ventilation without difficulty Laryngoscope Size: Mac, 3, Miller, 1 and Glidescope Grade View: Grade III Tube type: Oral Tube size: 7.0 mm Number of attempts: 3 Placement Confirmation: positive ETCO2,  ETT inserted through vocal cords under direct vision and breath sounds checked- equal and bilateral Secured at: 20 cm Tube secured with: Tape Dental Injury: Injury to lip

## 2015-06-01 NOTE — Clinical Social Work Note (Signed)
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  Yes Need for family participation in patient care:  Yes (Comment) (Patient's daughter active in patient's care.)  Care giving concerns:  Patient's daughter expressed concern regarding patient returning [facility]. Patient's daughter concerned regarding "bed hold" and availability for patient to return to SNF at time of discharge.   Social Worker assessment / plan:  CSW received referral regarding patient admitted from [facility]. CSW spoke with patient's daughter regarding discharge disposition. Per patient's daughter, patient admitted from [facility], but facility is unable to hold patient's bed for duration of patient's hospital stay. Patient's daughter expressed concern and apprehension regarding finding new SNF placement at time of discharge. CSW provided patient's daughter with support and  resources regarding SNF placement. CSW to continue to follow and assist with discharge planning needs.  Employment status:  Retired Forensic scientist:  Programmer, applications (Marine scientist) PT Recommendations:  [place] / Referral to community resources:  [place]  Patient/Family's Response to care:  Patient's daughter understanding and agreeable to CSW plan of care.  Patient/Family's Understanding of and Emotional Response to Diagnosis, Current Treatment, and Prognosis:  Patient's daughter understanding and agreeable to CSW plan of care.  Emotional Assessment Appearance:  Other (Comment Required (Patient had not returned from OR at time of assessment.) Attitude/Demeanor/Rapport:  Other (Patient had not returned from OR at time of assessment.) Affect (typically observed):  Other (Patient had not returned from OR at time of assessment.) Orientation:  Oriented to Self, Oriented to Place, Oriented to  Time, Oriented to Situation Alcohol / Substance use:  Not Applicable Psych involvement (Current and /or in the community):  No (Comment) (Not appropriate on this admission.)  Discharge Needs  Concerns to be addressed:  No discharge needs identified Readmission within the last 30 days:  No Current discharge risk:  None Barriers to Discharge:  No Barriers Identified   Caroline Sauger, LCSW 06/01/2015, 3:21 PM [phone]

## 2015-06-01 NOTE — Anesthesia Postprocedure Evaluation (Signed)
  Anesthesia Post-op Note  Patient: Theresa Rodriguez  Procedure(s) Performed: Procedure(s): ARTHROPLASTY BIPOLAR HIP (HEMIARTHROPLASTY) for Femur Fracture (Right)  Patient Location: PACU  Anesthesia Type:General  Level of Consciousness: awake and alert   Airway and Oxygen Therapy: Patient Spontanous Breathing  Post-op Pain: mild  Post-op Assessment: Post-op Vital signs reviewed              Post-op Vital Signs: Reviewed  Last Vitals:  Filed Vitals:   06/01/15 1500  BP: 113/66  Pulse: 84  Temp: 37.4 C  Resp:     Complications: No apparent anesthesia complications

## 2015-06-01 NOTE — Anesthesia Preprocedure Evaluation (Addendum)
Anesthesia Evaluation  Patient identified by MRN, date of birth, ID band Patient awake and Patient confused    Reviewed: Allergy & Precautions, H&P , NPO status , Patient's Chart, lab work & pertinent test results  Airway Mallampati: II  TM Distance: >3 FB Neck ROM: Full    Dental no notable dental hx. (+) Dental Advidsory Given   Pulmonary neg pulmonary ROS, former smoker,  breath sounds clear to auscultation  Pulmonary exam normal       Cardiovascular hypertension, On Medications Normal cardiovascular exam+ dysrhythmias Rhythm:Regular Rate:Normal     Neuro/Psych PSYCHIATRIC DISORDERS alzheimers negative psych ROS   GI/Hepatic negative GI ROS, Neg liver ROS,   Endo/Other  Hypothyroidism   Renal/GU Renal InsufficiencyRenal disease  negative genitourinary   Musculoskeletal negative musculoskeletal ROS (+)   Abdominal   Peds negative pediatric ROS (+)  Hematology negative hematology ROS (+) anemia ,   Anesthesia Other Findings   Reproductive/Obstetrics negative OB ROS                           Lab Results  Component Value Date   WBC 8.4 06/01/2015   HGB 10.4* 06/01/2015   HCT 33.4* 06/01/2015   MCV 95.4 06/01/2015   PLT 168 06/01/2015   Lab Results  Component Value Date   CREATININE 0.91 06/01/2015   BUN 17 06/01/2015   NA 137 06/01/2015   K 4.2 06/01/2015   CL 99* 06/01/2015   CO2 31 06/01/2015    Anesthesia Physical Anesthesia Plan  ASA: III  Anesthesia Plan: General   Post-op Pain Management:    Induction: Intravenous  Airway Management Planned: Oral ETT  Additional Equipment:   Intra-op Plan:   Post-operative Plan: Extubation in OR and Possible Post-op intubation/ventilation  Informed Consent: I have reviewed the patients History and Physical, chart, labs and discussed the procedure including the risks, benefits and alternatives for the proposed anesthesia with  the patient or authorized representative who has indicated his/her understanding and acceptance.   Dental advisory given and Dental Advisory Given  Plan Discussed with: CRNA, Surgeon and Anesthesiologist  Anesthesia Plan Comments:       Anesthesia Quick Evaluation

## 2015-06-01 NOTE — Progress Notes (Addendum)
SLP Cancellation Note  Patient Details Name: Enza Shone MRN: [id number] DOB: Nov 20, 1921   Cancelled treatment:       Reason Eval/Treat Not Completed: Patient at procedure or test/unavailable (pt in OR this morning). Will f/u as able.  Returned this afternoon, however pt remains lethargic post-procedure. Discussed with RN, will return on next date.   Germain Osgood, M.A. CCC-SLP [phone]  Germain Osgood 06/01/2015, 10:41 AM

## 2015-06-01 NOTE — Op Note (Signed)
Procedure(s): ARTHROPLASTY  HIP (HEMIARTHROPLASTY) for Femur Fracture Procedure Note  Vandana Haman female 81 y.o. 06/01/2015  Procedure(s) and Anesthesia Type:    *RIGHT ARTHROPLASTY HIP (HEMIARTHROPLASTY) for Femur Fracture - General  Surgeon(s) and Role:    * Tania Ade, MD - Primary   Indications:  81 y.o. female s/p fall with right hip fracture. Indicated for surgery to promote early ambulation, pain control and prevent complications of bed rest.     Surgeon: Nita Sells   Assistants: Jeanmarie Hubert PA-C (Danielle was present and scrubbed throughout the procedure and was essential in positioning, retraction, exposure, and closure)  Anesthesia: General endotracheal anesthesia    Procedure Detail  ARTHROPLASTY  HIP (HEMIARTHROPLASTY) for Femur Fracture  Findings: DePuy size 3 cemented [place] basic stem with 46+5 offset head  Estimated Blood Loss:  200 mL         Drains: none  Blood Given: none          Specimens: none        Complications:  * No complications entered in OR log *         Disposition: PACU - hemodynamically stable.         Condition: stable    Procedure:  The patient was identified in the preoperative  holding area where I personally marked the operative site after  verifying site side and procedure with the patient. She was taken back  to the operating room where general anesthesia was induced without  Complication. The patient was placed in lateral decubitus position with the  right side up. The right lower extremity was then prepped and draped in standard sterile fashion. The patient did receive IV antibiotics prior to the  incision.   After the appropriate time-out, an approximately 12-cm  incision was made over the posterior third of the greater trochanter.  Dissection was carried down to the fascia which was split longitudinally  in line with the incision. The piriformis was tagged and the external  rotators  were then taken down off the posterior greater trochanter in 1  sheath with the posterior capsule. The fracture was exposed. Posterior  capsule and external rotators were split just below the piriformis down  to the level of the acetabulum. Great care was taken to protect the  sciatic nerve. The proximal femoral cut was then made using the cut  guide and the femoral head was then removed and sized and felt to be 46.  The proximal femur was then prepared by first using an intramedullary  canal finder, a lateralizer and then sequentially broaching from 1 to 3.   The size 46 head with +5 offset neck was then placed and a trial reduction was performed. It was felt  to be excellent in terms of the soft tissue tension. I was able to  bring up to 90 degrees of flexion and 70 degrees internal rotation  without any instability. Leg lengths were felt to be appropriate. The  hip was dislocated. The broach was then removed. The cement restrictor  was then placed and the canal was prepared with pulse lavage with a  brush. The size 3 stem was then cemented into place in approximately 15-20  degrees anteversion. It was held until the cement was hardened and  cool. The size 46 +5  offset head was then placed and impacted. It  was then reduced after ensuring that there was nothing in the  acetabulum. The hip reduced nicely and again it was taken through the  trial. I was able to flex to 90 degrees, internal rotation to 70  without any instability. Soft tissue tension was excellent and lengths  were felt to be equal. The joint was then copiously irrigated with  normal saline with pulse lavage and then the external rotators were  repaired using Ethibond sutures through the greater trochanter and tied  over a bone bridge. The deep fascia was then closed using #1 Vicryl in  running fashion proximally and distally. The skin was then closed using  2-0 Vicryl in deep dermal layer and staples for skin closure.  Sterile  dressings were then applied including Mepilex dressing. The patient was  then placed in an abduction pillow, rolled into supine position and  extubated. He was then transferred to the PACU in stable condition.    POSTOPERATIVE PLAN: The patient will be weightbearing as tolerated on the operative Extremity with posterior hip precautions and will have DVT prophylaxis of ECASA BID.

## 2015-06-01 NOTE — Progress Notes (Signed)
[facility] PROGRESS NOTE  Theresa Rodriguez OEU:[id number] DOB: 11-25-1921 DOA: 05/31/2015 PCP: Woody Seller, MD  Assessment/Plan: #1 right hip fracture Secondary to mechanical fall. Patient status post right hip hemiarthroplasty per Dr. Tamera Punt 06/01/2015. PT/OT. Per orthopedics.  #2 end-stage dementia with behavioral disturbances Stable. Continue home regimen of Depakote sprinkles, Remeron, Celexa.  #3 dehydration Secondary to poor oral intake. Gentle hydration.  #4 chronic kidney disease stage III Stable. Follow.  #5 hypertension Per admitting physician, patient's blood pressure medicines were discontinued and blood pressure has been controlled. Follow for now.  #6 depression/anxiety Stable. Continue Celexa.  #7??? Hypothyroidism TSH slightly elevated at 5.082. Per family patient has never been on Synthroid as pharmacy has checked. Will likely need repeat thyroid function studies done in about 4-6 weeks. Follow for now.  #8 anemia H&H stable. Follow.  #9 prophylaxis DVT prophylaxis per orthopedics.  Code Status: DO NOT RESUSCITATE Family Communication: No family at bedside. Disposition Plan: Likely need skilled nursing facility pending PT evaluation.   Consultants:  Orthopedics: Dr. Tamera Punt 05/31/2015  Procedures:  Right hip hemiarthroplasty for femur fracture Dr. Tamera Punt 06/01/2015  CT head CT C-spine 05/31/2015  Chest x-ray 05/31/2015  X-ray of the right hip with pelvis 05/31/2015  Antibiotics:  None  HPI/Subjective: Patient is postoperative. Sleeping.  Objective: Filed Vitals:   06/01/15 1500  BP: 113/66  Pulse: 84  Temp: 99.3 F (37.4 C)  Resp:     Intake/Output Summary (Last 24 hours) at 06/01/15 1712 Last data filed at 06/01/15 1420  Gross per 24 hour  Intake   1650 ml  Output   2775 ml  Net  -1125 ml   There were no vitals filed for this visit.  Exam:   General:  Postop. Asleep.  Cardiovascular: Regular rate  and rhythm.  Respiratory: Clear to auscultation bilaterally anterior lung fields.  Abdomen: Soft, nontender, nondistended, positive bowel sounds.  Musculoskeletal: No clubbing cyanosis or edema. Right lower extremity in knee immobilizer.  Data Reviewed: Basic Metabolic Panel:  Recent Labs Lab 05/31/15 0910 06/01/15 0547  NA 142 137  K 4.7 4.2  CL 101 99*  CO2 31 31  GLUCOSE 94 104*  BUN 39* 17  CREATININE 1.20* 0.91  CALCIUM 9.3 8.6*   Liver Function Tests: No results for input(s): AST, ALT, ALKPHOS, BILITOT, PROT, ALBUMIN in the last 168 hours. No results for input(s): LIPASE, AMYLASE in the last 168 hours. No results for input(s): AMMONIA in the last 168 hours. CBC:  Recent Labs Lab 05/31/15 0910 06/01/15 0547  WBC 6.3 8.4  NEUTROABS 4.7  --   HGB 11.0* 10.4*  HCT 35.0* 33.4*  MCV 95.4 95.4  PLT 167 168   Cardiac Enzymes: No results for input(s): CKTOTAL, CKMB, CKMBINDEX, TROPONINI in the last 168 hours. BNP (last 3 results) No results for input(s): BNP in the last 8760 hours.  ProBNP (last 3 results) No results for input(s): PROBNP in the last 8760 hours.  CBG: No results for input(s): GLUCAP in the last 168 hours.  Recent Results (from the past 240 hour(s))  MRSA PCR Screening     Status: None   Collection Time: 06/01/15 12:21 AM  Result Value Ref Range Status   MRSA by PCR NEGATIVE NEGATIVE Final    Comment:        The GeneXpert MRSA Assay (FDA approved for NASAL specimens only), is one component of a comprehensive MRSA colonization surveillance program. It is not intended to diagnose MRSA infection nor to guide or monitor  treatment for MRSA infections.      Studies: Dg Chest 1 View  05/31/2015   CLINICAL DATA:  Right hip pain.  Preop right hip ORIF.  EXAM: CHEST  1 VIEW  COMPARISON:  02/03/2014  FINDINGS: The lungs are hyperinflated likely secondary to COPD. There is no focal parenchymal opacity. There is no pleural effusion or  pneumothorax. The heart and mediastinal contours are unremarkable.  The osseous structures are unremarkable.  IMPRESSION: No active disease.   Electronically Signed   By: Kathreen Devoid   On: 05/31/2015 09:44   Ct Head Wo Contrast  05/31/2015   CLINICAL DATA:  Pain following fall  EXAM: CT HEAD WITHOUT CONTRAST  CT CERVICAL SPINE WITHOUT CONTRAST  TECHNIQUE: Multidetector CT imaging of the head and cervical spine was performed following the standard protocol without intravenous contrast. Multiplanar CT image reconstructions of the cervical spine were also generated.  COMPARISON:  Head CT September 22, 2013  FINDINGS: CT HEAD FINDINGS  Motion artifact makes this study somewhat less than optimal. There is moderate diffuse atrophy, stable. There is no intracranial mass, hemorrhage, extra-axial fluid collection, or midline shift. There is patchy small vessel disease in the centra semiovale bilaterally. There is no acute infarct evident on this study. There is midline frontal scalp hematoma. The bony calvarium appears intact. The mastoid air cells are clear.  CT CERVICAL SPINE FINDINGS  Motion artifact makes this study less than optimal. No fracture or spondylolisthesis is appreciable on this study. Prevertebral soft tissues and predental space regions are normal. There is milder disc space narrowing at C3-4, C4-5, and C5-6. There is moderate facet hypertrophy at essentially all levels bilaterally. No frank disc extrusion or high-grade stenosis appreciable. There is mild scarring in each lung apex.  IMPRESSION: CT head: Study limited due to motion artifact. There is atrophy with periventricular small vessel disease. No acute infarct evident. No hemorrhage, mass, or extra-axial fluid. Midline scalp hematoma without underlying fracture appreciable.  CT cervical spine: Study less than optimal due to motion artifact. Moderate osteoarthritic change at multiple levels. No fracture or spondylolisthesis apparent.   Electronically  Signed   By: Lowella Grip III M.D.   On: 05/31/2015 10:01   Ct Cervical Spine Wo Contrast  05/31/2015   CLINICAL DATA:  Pain following fall  EXAM: CT HEAD WITHOUT CONTRAST  CT CERVICAL SPINE WITHOUT CONTRAST  TECHNIQUE: Multidetector CT imaging of the head and cervical spine was performed following the standard protocol without intravenous contrast. Multiplanar CT image reconstructions of the cervical spine were also generated.  COMPARISON:  Head CT September 22, 2013  FINDINGS: CT HEAD FINDINGS  Motion artifact makes this study somewhat less than optimal. There is moderate diffuse atrophy, stable. There is no intracranial mass, hemorrhage, extra-axial fluid collection, or midline shift. There is patchy small vessel disease in the centra semiovale bilaterally. There is no acute infarct evident on this study. There is midline frontal scalp hematoma. The bony calvarium appears intact. The mastoid air cells are clear.  CT CERVICAL SPINE FINDINGS  Motion artifact makes this study less than optimal. No fracture or spondylolisthesis is appreciable on this study. Prevertebral soft tissues and predental space regions are normal. There is milder disc space narrowing at C3-4, C4-5, and C5-6. There is moderate facet hypertrophy at essentially all levels bilaterally. No frank disc extrusion or high-grade stenosis appreciable. There is mild scarring in each lung apex.  IMPRESSION: CT head: Study limited due to motion artifact. There is atrophy with periventricular  small vessel disease. No acute infarct evident. No hemorrhage, mass, or extra-axial fluid. Midline scalp hematoma without underlying fracture appreciable.  CT cervical spine: Study less than optimal due to motion artifact. Moderate osteoarthritic change at multiple levels. No fracture or spondylolisthesis apparent.   Electronically Signed   By: Lowella Grip III M.D.   On: 05/31/2015 10:01   Pelvis Portable  06/01/2015   CLINICAL DATA:  Postop right hip  hemiarthroplasty for right femoral neck fracture.  EXAM: PORTABLE PELVIS 1-2 VIEWS  COMPARISON:  Preoperative AP pelvis and right hip x-rays yesterday.  FINDINGS: Anatomic alignment post cemented right hip hemiarthroplasty. No acute complicating features. Osseous demineralization throughout the pelvis and degenerative changes in the lower lumbar spine again noted.  IMPRESSION: Anatomic alignment post right hip hemiarthroplasty without acute complicating features.   Electronically Signed   By: Evangeline Dakin M.D.   On: 06/01/2015 15:23   Dg Hip Unilat With Pelvis 2-3 Views Right  05/31/2015   CLINICAL DATA:  Status post fall today. Right hip pain. Initial encounter.  EXAM: DG HIP (WITH OR WITHOUT PELVIS) 2-3V RIGHT  COMPARISON:  None.  FINDINGS: The patient has an acute fracture of the mid aspect of the right femoral neck. The femoral head is located. Mild varus angulation about the fracture is identified. No other acute bony or joint abnormality is seen.  IMPRESSION: Acute fracture mid right femoral neck.   Electronically Signed   By: Inge Rise M.D.   On: 05/31/2015 09:44    Scheduled Meds: . aspirin EC  325 mg Oral BID  .  ceFAZolin (ANCEF) IV  2 g Intravenous Q6H  . cholecalciferol  2,000 Units Oral Daily  . citalopram  10 mg Oral Daily  . divalproex  250 mg Oral TID  . docusate sodium  100 mg Oral BID  . heparin  5,000 Units Subcutaneous 3 times per day  . meloxicam  7.5 mg Oral Daily  . meperidine      . mirtazapine  7.5 mg Oral QHS  . multivitamin  5 mL Oral Daily   Continuous Infusions: . lactated ringers 50 mL/hr at 06/01/15 1616    Principal Problem:   Closed right hip fracture Active Problems:   Anxiety and depression   Hypothyroidism   Alzheimer's dementia with behavioral disturbance   Panic attacks   Essential hypertension, benign   CKD (chronic kidney disease), stage III   Anemia    Time spent: 27 minutes    THOMPSON,DANIEL M.D. [facility] Pager  [phone]. If 7PM-7AM, please contact night-coverage at [email or web address], password [facility] 06/01/2015, 5:12 PM  LOS: 1 day

## 2015-06-01 NOTE — Transfer of Care (Signed)
Immediate Anesthesia Transfer of Care Note  Patient: Theresa Rodriguez  Procedure(s) Performed: Procedure(s): ARTHROPLASTY BIPOLAR HIP (HEMIARTHROPLASTY) for Femur Fracture (Right)  Patient Location: PACU  Anesthesia Type:General  Level of Consciousness: awake  Airway & Oxygen Therapy: Patient Spontanous Breathing and Patient connected to face mask oxygen  Post-op Assessment: Report given to RN, Post -op Vital signs reviewed and stable and Patient moving all extremities X 4  Post vital signs: Reviewed and stable  Last Vitals:  Filed Vitals:   06/01/15 0954  BP: 145/83  Pulse: 92  Temp: 37.4 C  Resp: 16    Complications: No apparent anesthesia complications

## 2015-06-01 NOTE — Discharge Instructions (Signed)
Discharge Instructions after Hip Hemiarthroplasty   You can bear weight and ambulate as tolerated on both legs Observe posterior hip precautions Use ice on the hip intermittently over the first 48 hours after surgery.  Pain medicine has been prescribed for you.  Use your medicine liberally over the first 48 hours, and then you can begin to taper your use. You may take Extra Strength Tylenol or Tylenol only in place of the pain pills. DO NOT take ANY nonsteroidal anti-inflammatory pain medications: Advil, Motrin, Ibuprofen, Aleve, Naproxen or Naprosyn.  Take aspirin or anticoagulant medication as prescribed for 2 weeks after surgery. Please notify if allergic or sensitivity to aspirin. Keep your dressing on until the first visit with Dr. Tamera Punt, it is waterproof and okay to shower with it on.    Please call [phone] during normal business hours or [phone] after hours for any problems. Including the following:  - excessive redness of the incisions - drainage for more than 4 days - fever of more than 101.5 F  *Please note that pain medications will not be refilled after hours or on weekends.

## 2015-06-01 NOTE — Progress Notes (Signed)
Pt still shivering severly. Dr.R.Fitzgerald notified. Warm blankets applied. Demerol ordered. See orders

## 2015-06-02 ENCOUNTER — Encounter (HOSPITAL_COMMUNITY): Payer: Self-pay | Admitting: Orthopedic Surgery

## 2015-06-02 ENCOUNTER — Other Ambulatory Visit: Payer: Self-pay

## 2015-06-02 ENCOUNTER — Inpatient Hospital Stay (HOSPITAL_COMMUNITY): Payer: Medicare Other

## 2015-06-02 DIAGNOSIS — N183 Chronic kidney disease, stage 3 (moderate): Secondary | ICD-10-CM

## 2015-06-02 DIAGNOSIS — G308 Other Alzheimer's disease: Secondary | ICD-10-CM

## 2015-06-02 DIAGNOSIS — S72001G Fracture of unspecified part of neck of right femur, subsequent encounter for closed fracture with delayed healing: Secondary | ICD-10-CM

## 2015-06-02 LAB — BASIC METABOLIC PANEL
Anion gap: 7 (ref 5–15)
BUN: 16 mg/dL (ref 6–20)
CALCIUM: 8 mg/dL — AB (ref 8.9–10.3)
CHLORIDE: 102 mmol/L (ref 101–111)
CO2: 29 mmol/L (ref 22–32)
CREATININE: 1.25 mg/dL — AB (ref 0.44–1.00)
GFR calc non Af Amer: 37 mL/min — ABNORMAL LOW (ref >60)
GFR, EST AFRICAN AMERICAN: 43 mL/min — AB (ref >60)
GLUCOSE: 100 mg/dL — AB (ref 65–99)
Potassium: 4.4 mmol/L (ref 3.5–5.1)
Sodium: 138 mmol/L (ref 135–145)

## 2015-06-02 LAB — CBC
HEMATOCRIT: 24.5 % — AB (ref 36.0–46.0)
HEMOGLOBIN: 7.8 g/dL — AB (ref 12.0–15.0)
MCH: 30.5 pg (ref 26.0–34.0)
MCHC: 31.8 g/dL (ref 30.0–36.0)
MCV: 95.7 fL (ref 78.0–100.0)
Platelets: 160 10*3/uL (ref 150–400)
RBC: 2.56 MIL/uL — ABNORMAL LOW (ref 3.87–5.11)
RDW: 14.1 % (ref 11.5–15.5)
WBC: 11.1 10*3/uL — ABNORMAL HIGH (ref 4.0–10.5)

## 2015-06-02 MED ORDER — ASPIRIN EC 325 MG PO TBEC: 325.0000 mg | DELAYED_RELEASE_TABLET | Freq: Two times a day (BID) | ORAL | Status: DC

## 2015-06-02 MED ORDER — SODIUM CHLORIDE 0.9 % IV SOLN
INTRAVENOUS | Status: AC
  Administered 2015-06-02: 23:00:00 via INTRAVENOUS

## 2015-06-02 MED ORDER — HYDROCODONE-ACETAMINOPHEN 5-325 MG PO TABS: 1.0000 | ORAL_TABLET | Freq: Four times a day (QID) | ORAL | Status: AC | PRN

## 2015-06-02 NOTE — Progress Notes (Signed)
OT Cancellation Note  Patient Details Name: Theresa Rodriguez MRN: [id number] DOB: August 21, 1922   Cancelled Treatment:    Reason Eval/Treat Not Completed: Other (comment) Pt is from SNF  and current D/C plan is to D/C back to SNF. No apparent immediate acute care OT needs, therefore will defer OT to SNF. If OT eval is needed please call Acute Rehab Dept. at [phone] or text page OT at [phone].  Price, OTR/L  [phone] 06/02/2015 06/02/2015, 7:26 AM

## 2015-06-02 NOTE — Progress Notes (Signed)
Utilization review completed.  

## 2015-06-02 NOTE — Progress Notes (Signed)
PROGRESS NOTE  Theresa Rodriguez NIO:[id number] DOB: 1922/03/10 DOA: 05/31/2015 PCP: Woody Seller, MD  Assessment/Plan: right hip fracture  Secondary to mechanical fall. Patient status post right hip hemiarthroplasty per Dr. Tamera Punt 06/01/2015.  -PT/OT. Per orthopedics.  Mechanical fall -Late afternoon 06/02/2015--patient was found sitting on the floor -EKG sinus rhythm without any ischemic changes -Obtain pelvic x-ray rule out any dislocation -move to camera room -bed alarm  end-stage dementia with behavioral disturbances Stable. Continue home regimen of Depakote sprinkles, Remeron, Celexa.  dehydration Secondary to poor oral intake. Gentle hydration.  chronic kidney disease stage II- III Stable. Follow. -Baseline creatinine 0.9-1.2 -Discontinue Mobic -Increase IV fluids to 75 mL per hour  hypertension Per admitting physician, patient's blood pressure medicines were discontinued and blood pressure has been controlled. Follow for now.  depression/anxiety Stable. Continue Celexa. -Continue Depakote  ??? Hypothyroidism TSH slightly elevated at 5.082. Per family patient has never been on Synthroid as pharmacy has checked.  - check free T4 -Suspect euthyroid sick syndrome  anemia -Hemoglobin dilutional drop -Baseline hemoglobin-10 -Transfuse for hemoglobin less than 7  prophylaxis DVT prophylaxis per orthopedics.  Code Status: DO NOT RESUSCITATE Family Communication: Daughter updated at bedside. Disposition Plan: SNF in 1-2 days       Procedures/Studies: Dg Chest 1 View  05/31/2015   CLINICAL DATA:  Right hip pain.  Preop right hip ORIF.  EXAM: CHEST  1 VIEW  COMPARISON:  02/03/2014  FINDINGS: The lungs are hyperinflated likely secondary to COPD. There is no focal parenchymal opacity. There is no pleural effusion or pneumothorax. The heart and mediastinal contours are unremarkable.  The osseous structures are unremarkable.  IMPRESSION: No active  disease.   Electronically Signed   By: Kathreen Devoid   On: 05/31/2015 09:44   Ct Head Wo Contrast  05/31/2015   CLINICAL DATA:  Pain following fall  EXAM: CT HEAD WITHOUT CONTRAST  CT CERVICAL SPINE WITHOUT CONTRAST  TECHNIQUE: Multidetector CT imaging of the head and cervical spine was performed following the standard protocol without intravenous contrast. Multiplanar CT image reconstructions of the cervical spine were also generated.  COMPARISON:  Head CT September 22, 2013  FINDINGS: CT HEAD FINDINGS  Motion artifact makes this study somewhat less than optimal. There is moderate diffuse atrophy, stable. There is no intracranial mass, hemorrhage, extra-axial fluid collection, or midline shift. There is patchy small vessel disease in the centra semiovale bilaterally. There is no acute infarct evident on this study. There is midline frontal scalp hematoma. The bony calvarium appears intact. The mastoid air cells are clear.  CT CERVICAL SPINE FINDINGS  Motion artifact makes this study less than optimal. No fracture or spondylolisthesis is appreciable on this study. Prevertebral soft tissues and predental space regions are normal. There is milder disc space narrowing at C3-4, C4-5, and C5-6. There is moderate facet hypertrophy at essentially all levels bilaterally. No frank disc extrusion or high-grade stenosis appreciable. There is mild scarring in each lung apex.  IMPRESSION: CT head: Study limited due to motion artifact. There is atrophy with periventricular small vessel disease. No acute infarct evident. No hemorrhage, mass, or extra-axial fluid. Midline scalp hematoma without underlying fracture appreciable.  CT cervical spine: Study less than optimal due to motion artifact. Moderate osteoarthritic change at multiple levels. No fracture or spondylolisthesis apparent.   Electronically Signed   By: Lowella Grip III M.D.   On: 05/31/2015 10:01   Ct Cervical Spine Wo Contrast  05/31/2015   CLINICAL DATA:   Pain following fall  EXAM: CT HEAD WITHOUT CONTRAST  CT CERVICAL SPINE WITHOUT CONTRAST  TECHNIQUE: Multidetector CT imaging of the head and cervical spine was performed following the standard protocol without intravenous contrast. Multiplanar CT image reconstructions of the cervical spine were also generated.  COMPARISON:  Head CT September 22, 2013  FINDINGS: CT HEAD FINDINGS  Motion artifact makes this study somewhat less than optimal. There is moderate diffuse atrophy, stable. There is no intracranial mass, hemorrhage, extra-axial fluid collection, or midline shift. There is patchy small vessel disease in the centra semiovale bilaterally. There is no acute infarct evident on this study. There is midline frontal scalp hematoma. The bony calvarium appears intact. The mastoid air cells are clear.  CT CERVICAL SPINE FINDINGS  Motion artifact makes this study less than optimal. No fracture or spondylolisthesis is appreciable on this study. Prevertebral soft tissues and predental space regions are normal. There is milder disc space narrowing at C3-4, C4-5, and C5-6. There is moderate facet hypertrophy at essentially all levels bilaterally. No frank disc extrusion or high-grade stenosis appreciable. There is mild scarring in each lung apex.  IMPRESSION: CT head: Study limited due to motion artifact. There is atrophy with periventricular small vessel disease. No acute infarct evident. No hemorrhage, mass, or extra-axial fluid. Midline scalp hematoma without underlying fracture appreciable.  CT cervical spine: Study less than optimal due to motion artifact. Moderate osteoarthritic change at multiple levels. No fracture or spondylolisthesis apparent.   Electronically Signed   By: Lowella Grip III M.D.   On: 05/31/2015 10:01   Pelvis Portable  06/01/2015   CLINICAL DATA:  Postop right hip hemiarthroplasty for right femoral neck fracture.  EXAM: PORTABLE PELVIS 1-2 VIEWS  COMPARISON:  Preoperative AP pelvis and right  hip x-rays yesterday.  FINDINGS: Anatomic alignment post cemented right hip hemiarthroplasty. No acute complicating features. Osseous demineralization throughout the pelvis and degenerative changes in the lower lumbar spine again noted.  IMPRESSION: Anatomic alignment post right hip hemiarthroplasty without acute complicating features.   Electronically Signed   By: Evangeline Dakin M.D.   On: 06/01/2015 15:23   Dg Hip Unilat With Pelvis 2-3 Views Right  05/31/2015   CLINICAL DATA:  Status post fall today. Right hip pain. Initial encounter.  EXAM: DG HIP (WITH OR WITHOUT PELVIS) 2-3V RIGHT  COMPARISON:  None.  FINDINGS: The patient has an acute fracture of the mid aspect of the right femoral neck. The femoral head is located. Mild varus angulation about the fracture is identified. No other acute bony or joint abnormality is seen.  IMPRESSION: Acute fracture mid right femoral neck.   Electronically Signed   By: Inge Rise M.D.   On: 05/31/2015 09:44         Subjective: Patient is pleasantly confused. Denies any chest pain, shortness breath, vomiting. No reports of diarrhea or uncontrolled pain.  Objective: Filed Vitals:   06/01/15 1938 06/02/15 0031 06/02/15 0413 06/02/15 1400  BP: 129/75 109/56 98/54 94/58   Pulse: 93 95 81 94  Temp: 99.8 F (37.7 C) 97.6 F (36.4 C) 98.1 F (36.7 C) 98.2 F (36.8 C)  TempSrc: Axillary Oral Axillary   Resp: 15 15 16 16   SpO2: 100% 100% 99% 98%    Intake/Output Summary (Last 24 hours) at 06/02/15 1915 Last data filed at 06/02/15 1111  Gross per 24 hour  Intake    120 ml  Output    300 ml  Net   -  180 ml   Weight change:  Exam:   General:  Pt is alert, follows commands appropriately, not in acute distress  HEENT: No icterus, No thrush,  /AT  Cardiovascular: RRR, S1/S2, no rubs, no gallops  Respiratory: CTA bilaterally, no wheezing, no crackles, no rhonchi  Abdomen: Soft/+BS, non tender, non distended, no guarding  Extremities: No  edema, No lymphangitis, No petechiae, No rashes, no synovitis  Data Reviewed: Basic Metabolic Panel:  Recent Labs Lab 05/31/15 0910 06/01/15 0547 06/02/15 0616  NA 142 137 138  K 4.7 4.2 4.4  CL 101 99* 102  CO2 31 31 29   GLUCOSE 94 104* 100*  BUN 39* 17 16  CREATININE 1.20* 0.91 1.25*  CALCIUM 9.3 8.6* 8.0*   Liver Function Tests: No results for input(s): AST, ALT, ALKPHOS, BILITOT, PROT, ALBUMIN in the last 168 hours. No results for input(s): LIPASE, AMYLASE in the last 168 hours. No results for input(s): AMMONIA in the last 168 hours. CBC:  Recent Labs Lab 05/31/15 0910 06/01/15 0547 06/02/15 0616  WBC 6.3 8.4 11.1*  NEUTROABS 4.7  --   --   HGB 11.0* 10.4* 7.8*  HCT 35.0* 33.4* 24.5*  MCV 95.4 95.4 95.7  PLT 167 168 160   Cardiac Enzymes: No results for input(s): CKTOTAL, CKMB, CKMBINDEX, TROPONINI in the last 168 hours. BNP: Invalid input(s): POCBNP CBG: No results for input(s): GLUCAP in the last 168 hours.  Recent Results (from the past 240 hour(s))  MRSA PCR Screening     Status: None   Collection Time: 06/01/15 12:21 AM  Result Value Ref Range Status   MRSA by PCR NEGATIVE NEGATIVE Final    Comment:        The GeneXpert MRSA Assay (FDA approved for NASAL specimens only), is one component of a comprehensive MRSA colonization surveillance program. It is not intended to diagnose MRSA infection nor to guide or monitor treatment for MRSA infections.      Scheduled Meds: . aspirin EC  325 mg Oral BID  . cholecalciferol  2,000 Units Oral Daily  . citalopram  10 mg Oral Daily  . divalproex  250 mg Oral TID  . docusate sodium  100 mg Oral BID  . heparin  5,000 Units Subcutaneous 3 times per day  . meloxicam  7.5 mg Oral Daily  . mirtazapine  7.5 mg Oral QHS  . multivitamin  5 mL Oral Daily   Continuous Infusions: . lactated ringers 50 mL/hr at 06/02/15 1618     Theresa Santoro, DO  [facility] Pager [phone]  If 7PM-7AM, please  contact night-coverage [email or web address] Password [email or web address] 06/02/2015, 7:15 PM   LOS: 2 days

## 2015-06-02 NOTE — Evaluation (Signed)
Physical Therapy Evaluation Patient Details Name: Theresa Rodriguez MRN: [id number] DOB: 1922-10-11 Today's Date: 06/02/2015   History of Present Illness  Pt is a 81 y/o F w/ alzheimer's dementia s/p Rt hip fx and hemiarthroplasty.  Pt's PMH includes dysphagia, dizziness and giddiness, hypothyroidism, osteoporosis, anxiety and depression, Rt breast cancer, and seizures.  Clinical Impression  Patient is s/p above surgery resulting in functional limitations due to the deficits listed below (see PT Problem List). Theresa Rodriguez was very pleasant, she required max assist for bed mobility and mod assist for sit<>stand and stand pivot transfer.  She is from a SNF and is planning to return to SNF upon d/c.  HR up to 147 bpm during stand pivot transfer, RN notified. Patient will benefit from skilled PT to increase their independence and safety with mobility to allow discharge to the venue listed below.      Follow Up Recommendations SNF;Supervision/Assistance - 24 hour    Equipment Recommendations  Other (comment) (TBD by next venue of care)    Recommendations for Other Services       Precautions / Restrictions Precautions Precautions: Posterior Hip;Fall Precaution Booklet Issued: Yes (comment) Precaution Comments: Reviewed post precautions w/ pt and pt's family.  Pt has fallen ~5x in the last year which the family says she has tripped on blankets on bed and has been pushed by other residents Required Braces or Orthoses: Knee Immobilizer - Right Knee Immobilizer - Right: Other (comment) (in room, no orders, did not use this session) Restrictions Weight Bearing Restrictions: Yes RLE Weight Bearing: Weight bearing as tolerated      Mobility  Bed Mobility Overal bed mobility: Needs Assistance Bed Mobility: Supine to Sit     Supine to sit: Max assist;HOB elevated     General bed mobility comments: Max assist for positioning and sequencing of BLEs and trunk. Increased time.  Pt demosntrates  ability to scoot to sitting EOB w/ mod assist.  Transfers Overall transfer level: Needs assistance Equipment used: Rolling walker (2 wheeled) Transfers: Sit to/from [facility] Sit to Stand: Mod assist Stand pivot transfers: Mod assist       General transfer comment: Mod assist as pt demonstrates severe instability in standing.  She is not used to using a RW at baseline; however she will need to use one w/ new balance deficits.  Mod assist to manage RW and to provide verbal and tactile cues during stand pivot and stand>sit.  Hand over hand for hand placement on recliner chair armrests for controlled descent to recliner chair.  Ambulation/Gait                Stairs            Wheelchair Mobility    Modified Rankin (Stroke Patients Only)       Balance Overall balance assessment: Needs assistance;History of Falls Sitting-balance support: Feet supported;No upper extremity supported Sitting balance-Leahy Scale: Fair Sitting balance - Comments: Min guard w/ pt sitting EOB 2/2 instability   Standing balance support: Bilateral upper extremity supported;During functional activity Standing balance-Leahy Scale: Poor                               Pertinent Vitals/Pain Pain Assessment: Faces Faces Pain Scale: Hurts even more Pain Location: Rt hip Pain Descriptors / Indicators: Grimacing;Moaning Pain Intervention(s): Limited activity within patient's tolerance;Monitored during session;Repositioned    Home Living Family/patient expects to be discharged to:: Skilled nursing  facility                 Additional Comments: Pt from SNF and is planning to return to same SNF    Prior Function Level of Independence: Needs assistance   Gait / Transfers Assistance Needed: Per family pt ambulating w/o AD Ind PTA  ADL's / Homemaking Assistance Needed: needs assist w/ all ADL's per pt's family        Hand Dominance        Extremity/Trunk  Assessment   Upper Extremity Assessment: Generalized weakness           Lower Extremity Assessment: Generalized weakness;RLE deficits/detail RLE Deficits / Details: weakness and limited ROM s/p Rt hemiarthroplasty       Communication   Communication: No difficulties  Cognition Arousal/Alertness: Awake/alert Behavior During Therapy: WFL for tasks assessed/performed Overall Cognitive Status: History of cognitive impairments - at baseline                      General Comments General comments (skin integrity, edema, etc.): Pt's HR up to 147 bpm during stand pivot transfer and pt shaking, RN notified.  Pt resting comfortably in recliner chair at end of session. Pt's two daughters present during session and helfpful w/ providing history and encouraging pt.    Exercises Total Joint Exercises Ankle Circles/Pumps: AAROM;Both;10 reps;Supine Heel Slides: AAROM;Right;5 reps;Supine Hip ABduction/ADduction: AAROM;Right;10 reps;Supine      Assessment/Plan    PT Assessment Patient needs continued PT services  PT Diagnosis Difficulty walking;Abnormality of gait;Generalized weakness;Acute pain   PT Problem List Decreased strength;Decreased range of motion;Decreased activity tolerance;Decreased balance;Decreased mobility;Decreased coordination;Decreased cognition;Decreased knowledge of use of DME;Decreased safety awareness;Decreased knowledge of precautions;Decreased skin integrity;Pain  PT Treatment Interventions DME instruction;Gait training;Functional mobility training;Therapeutic exercise;Therapeutic activities;Balance training;Neuromuscular re-education;Cognitive remediation;Patient/family education;Modalities   PT Goals (Current goals can be found in the Care Plan section) Acute Rehab PT Goals Patient Stated Goal: not stated PT Goal Formulation: With patient/family Time For Goal Achievement: 06/09/15 Potential to Achieve Goals: Good    Frequency Min 3X/week   Barriers to  discharge        Co-evaluation               End of Session Equipment Utilized During Treatment: Gait belt Activity Tolerance: Patient limited by fatigue;Patient limited by pain Patient left: in chair;with call bell/phone within reach;with family/visitor present Nurse Communication: Mobility status;Precautions;Weight bearing status         Time: 1020-1057 PT Time Calculation (min) (ACUTE ONLY): 37 min   Charges:   PT Evaluation $Initial PT Evaluation Tier I: 1 Procedure PT Treatments $Therapeutic Activity: 8-22 mins   PT G Codes:       Joslyn Hy PT, DPT [phone] Pager: [phone] 06/02/2015, 11:50 AM

## 2015-06-02 NOTE — Evaluation (Signed)
Clinical/Bedside Swallow Evaluation Patient Details  Name: Theresa Rodriguez MRN: [id number] Date of Birth: [birth year]  Today's Date: 06/02/2015 Time: SLP Start Time (ACUTE ONLY): 1001 SLP Stop Time (ACUTE ONLY): 1024 SLP Time Calculation (min) (ACUTE ONLY): 23 min  Past Medical History:  Past Medical History  Diagnosis Date  . Vitamin D deficiency   . Insomnia, unspecified   . Other specified cardiac dysrhythmias(427.89)   . Nausea with vomiting   . Dysphagia, oral phase   . Loss of weight   . Hyperpotassemia   . Dizziness and giddiness   . Chronic lymphocytic thyroiditis   . Other and unspecified hyperlipidemia   . Hypothyroidism   . Unspecified protein-calorie malnutrition   . Osteoarthritis, knee   . Senile osteoporosis   . Anxiety and depression   . Breast cancer     "right" (09/22/2013)  . Benign hypertension     "took off BP meds this fall" (09/22/2013)  . Alzheimer's disease     "probably moderate" (09/22/2013)  . Anxiety   . Seizures     "might have had one today; not sure" (09/22/2013   Past Surgical History:  Past Surgical History  Procedure Laterality Date  . Eye surgery    . Knee surgery Right 1940's    "cyst on her knee" (09/22/2013)  . Breast lumpectomy Right 1995   HPI:  81 yo female who presents with hip fx s/p fall at SNF. PMH: end-stage dementia with behavioral distrubances, vitamin D deficiency, HLD, hypothyroid, osteoporosis, anxiety, depression, HTN. Family reports a h/o poor PO intake and intermittent coughing with liquids. Previous MBS 03/2010 showed stasis of cracker in the thoracic esophagus. Recommendations from that test and most recent bedside evaluation 10/2013 are for regular textures and thin liquids.   Assessment / Plan / Recommendation Clinical Impression  Pt has immediate coughing with thin liquids despite cueing for small, single sips. Suspect a timing issue, likely related to both advanced age and dementia. Frequent eructation may also  be indicative of an esophageal component, as had been suspected during previous swallowing assessments. With nectar thick liquids and solids, pt does not show any further signs of aspiration. Mastication with regular textures is slow but functional. Would continue Dys 3 textures with chopped meats, adjusting liquids to nectar thick. Will follow briefly for tolerance. Family present in agreement with plan.    Aspiration Risk  Mild    Diet Recommendation Dysphagia 3 (Mech soft);Nectar   Medication Administration: Crushed with puree Compensations: Minimize environmental distractions;Slow rate;Small sips/bites    Other  Recommendations Oral Care Recommendations: Oral care BID Other Recommendations: Order thickener from pharmacy;Prohibited food (jello, ice cream, thin soups);Remove water pitcher    Follow Up Recommendations   SNF    Frequency and Duration min 2x/week  1 week   Pertinent Vitals/Pain n/a    SLP Swallow Goals     Swallow Study Prior Functional Status       General Other Pertinent Information: 81 yo female who presents with hip fx s/p fall at SNF. PMH: end-stage dementia with behavioral distrubances, vitamin D deficiency, HLD, hypothyroid, osteoporosis, anxiety, depression, HTN. Family reports a h/o poor PO intake and intermittent coughing with liquids. Previous MBS 03/2010 showed stasis of cracker in the thoracic esophagus. Recommendations from that test and most recent bedside evaluation 10/2013 are for regular textures and thin liquids. Type of Study: Bedside swallow evaluation Previous Swallow Assessment: see HPI Diet Prior to this Study: Dysphagia 3 (soft);Thin liquids Temperature Spikes Noted: Yes (99.8) Respiratory  Status: Room air History of Recent Intubation: Yes Length of Intubations (days):  (for surgery) Behavior/Cognition: Alert;Cooperative;Pleasant mood;Confused;Requires cueing Oral Cavity - Dentition: Adequate natural dentition/normal for  age Self-Feeding Abilities: Able to feed self;Needs assist Patient Positioning: Upright in bed Baseline Vocal Quality: Normal    Oral/Motor/Sensory Function Overall Oral Motor/Sensory Function: Appears within functional limits for tasks assessed   Ice Chips Ice chips: Not tested   Thin Liquid Thin Liquid: Impaired Presentation: Cup;Self Fed;Straw Pharyngeal  Phase Impairments: Suspected delayed Swallow;Cough - Immediate    Nectar Thick Nectar Thick Liquid: Impaired Presentation: Self Fed;Straw Pharyngeal Phase Impairments: Suspected delayed Swallow   Honey Thick Honey Thick Liquid: Not tested   Puree Puree: Within functional limits Presentation: Spoon   Solid    Solid: Within functional limits      Germain Osgood, M.A. CCC-SLP [phone]  Germain Osgood 06/02/2015,11:35 AM

## 2015-06-02 NOTE — Progress Notes (Signed)
   PATIENT ID: Theresa Rodriguez   1 Day Post-Op Procedure(s) (LRB): ARTHROPLASTY BIPOLAR HIP (HEMIARTHROPLASTY) for Femur Fracture (Right)  Subjective: Reports no pain right hip. Lying comfortably in bed.   Objective:  Filed Vitals:   06/02/15 0413  BP: 98/54  Pulse: 81  Temp: 98.1 F (36.7 C)  Resp: 16     Right hip dressing c/d/i Knee immobilizer in place Wiggles toes, distally NVI  Labs:   Recent Labs  05/31/15 0910 06/01/15 0547 06/02/15 0616  HGB 11.0* 10.4* 7.8*   Recent Labs  06/01/15 0547 06/02/15 0616  WBC 8.4 11.1*  RBC 3.50* 2.56*  HCT 33.4* 24.5*  PLT 168 160   Recent Labs  06/01/15 0547 06/02/15 0616  NA 137 138  K 4.2 4.4  CL 99* 102  CO2 31 29  BUN 17 16  CREATININE 0.91 1.25*  GLUCOSE 104* 100*  CALCIUM 8.6* 8.0*    Assessment and Plan: 1 day s/p right hip hemiarthroplasty for fracture WBAT with PT Tylenol for pain control, minimize nacotics if tolerating well Uncontrolled HTN overnight- treatment per medicine dispo to SNF when stable per primary team  ABLA- expected po, asymptomatic, will continue to monitor   VTE proph: ASA 325mg  BID, SCDs

## 2015-06-02 NOTE — Care Management Note (Signed)
Case Management Note  Patient Details  Name: Jeny Nield MRN: [id number] Date of Birth: 1922/03/29  Subjective/Objective:            right hip fracture, s/p right hemi-arthroplasty          Action/Plan: Patient from [facility], referral made to [place], [place] working on placement, possible return to facility vs new facility.   Expected Discharge Date:                  Expected Discharge Plan:  Skilled Nursing Facility  In-House Referral:  Clinical Social Work  Discharge planning Services  CM Consult  Post Acute Care Choice:  NA Choice offered to:  NA  DME Arranged:    DME Agency:     HH Arranged:    [place] Agency:     Status of Service:  In process, will continue to follow  Medicare Important Message Given:    Date Medicare IM Given:    Medicare IM give by:    Date Additional Medicare IM Given:    Additional Medicare Important Message give by:     If discussed at [place] of Stay Meetings, dates discussed:    Additional Comments:  Nila Nephew, RN 06/02/2015, 4:27 PM

## 2015-06-03 DIAGNOSIS — D62 Acute posthemorrhagic anemia: Secondary | ICD-10-CM

## 2015-06-03 DIAGNOSIS — E038 Other specified hypothyroidism: Secondary | ICD-10-CM

## 2015-06-03 DIAGNOSIS — S72001K Fracture of unspecified part of neck of right femur, subsequent encounter for closed fracture with nonunion: Secondary | ICD-10-CM

## 2015-06-03 LAB — BASIC METABOLIC PANEL
Anion gap: 6 (ref 5–15)
BUN: 19 mg/dL (ref 6–20)
CO2: 28 mmol/L (ref 22–32)
Calcium: 7.8 mg/dL — ABNORMAL LOW (ref 8.9–10.3)
Chloride: 104 mmol/L (ref 101–111)
Creatinine, Ser: 1.26 mg/dL — ABNORMAL HIGH (ref 0.44–1.00)
GFR calc Af Amer: 42 mL/min — ABNORMAL LOW (ref >60)
GFR calc non Af Amer: 36 mL/min — ABNORMAL LOW (ref >60)
Glucose, Bld: 106 mg/dL — ABNORMAL HIGH (ref 65–99)
Potassium: 4.4 mmol/L (ref 3.5–5.1)
Sodium: 138 mmol/L (ref 135–145)

## 2015-06-03 LAB — CBC
HCT: 19.2 % — ABNORMAL LOW (ref 36.0–46.0)
Hemoglobin: 6.2 g/dL — CL (ref 12.0–15.0)
MCH: 30.2 pg (ref 26.0–34.0)
MCHC: 32.3 g/dL (ref 30.0–36.0)
MCV: 93.7 fL (ref 78.0–100.0)
Platelets: 157 10*3/uL (ref 150–400)
RBC: 2.05 MIL/uL — ABNORMAL LOW (ref 3.87–5.11)
RDW: 14.2 % (ref 11.5–15.5)
WBC: 9.6 10*3/uL (ref 4.0–10.5)

## 2015-06-03 LAB — PREPARE RBC (CROSSMATCH)

## 2015-06-03 LAB — MAGNESIUM: Magnesium: 1.3 mg/dL — ABNORMAL LOW (ref 1.7–2.4)

## 2015-06-03 MED ORDER — DEXTROSE-NACL 5-0.9 % IV SOLN
INTRAVENOUS | Status: DC
  Administered 2015-06-03: 20:00:00 via INTRAVENOUS
  Filled 2015-06-03 (×4): qty 1000

## 2015-06-03 MED ORDER — TRAMADOL HCL 50 MG PO TABS
50.0000 mg | ORAL_TABLET | Freq: Four times a day (QID) | ORAL | Status: DC | PRN
  Administered 2015-06-03: 50 mg via ORAL
  Filled 2015-06-03: qty 1

## 2015-06-03 MED ORDER — HYDROCODONE-ACETAMINOPHEN 5-325 MG PO TABS
1.0000 | ORAL_TABLET | Freq: Four times a day (QID) | ORAL | Status: DC | PRN
  Administered 2015-06-04: 1 via ORAL
  Filled 2015-06-03: qty 1

## 2015-06-03 MED ORDER — MAGNESIUM SULFATE 2 GM/50ML IV SOLN
2.0000 g | Freq: Once | INTRAVENOUS | Status: AC
  Administered 2015-06-03: 2 g via INTRAVENOUS
  Filled 2015-06-03: qty 50

## 2015-06-03 MED ORDER — SODIUM CHLORIDE 0.9 % IV SOLN: Freq: Once | INTRAVENOUS | Status: AC

## 2015-06-03 MED ORDER — SODIUM CHLORIDE 0.9 % IV SOLN
Freq: Once | INTRAVENOUS | Status: AC
  Administered 2015-06-03: 11:00:00 via INTRAVENOUS

## 2015-06-03 MED ORDER — TRAMADOL HCL 50 MG PO TABS: 50.0000 mg | ORAL_TABLET | Freq: Four times a day (QID) | ORAL | Status: DC

## 2015-06-03 NOTE — Clinical Social Work Note (Addendum)
Disposition: Return to [facility] Transportation: [facility]AR Barriers to discharge: patient requiring 2 units of blood today Projected discharge: Friday 06/04/2015  CSW updated patient's daughter Jackelyn Poling [phone]) regarding disposition and projected discharge.  Daughter was agreeable and excited that patient is able to return to SNF (family was not financially able to hold bed).  SNF has also been updated and agreeable to accepting the patient on Friday 06/04/2015  Nonnie Done, LCSW [phone]  Psychiatric & Orthopedics (5N 1-8) Clinical Social Worker

## 2015-06-03 NOTE — Care Management Important Message (Signed)
Important Message  Patient Details  Name: Theresa Rodriguez MRN: [id number] Date of Birth: 01/19/1922   Medicare Important Message Given:  Yes-second notification given    Delorse Lek 06/03/2015, 2:21 PM

## 2015-06-03 NOTE — Progress Notes (Signed)
Observed by CN sitting on her buttocks across from her bed leaning up against the cabinet at 1830. Her daughter had left the room to go home around [place]. Assessed for injury by CN and this RN: BLE symmetrical and without pain. Daughter, Theresa Rodriguez, at patient's side and assisting in calming the patient. Patient is alert, responding to simple commands, oriented to self only. Blanket placed under patient and 2 RNs and 3 NTs gently lifted patient and returned to bed. Dr. Carles Collet arrived to assess 43. VS WNL for this patient including Temp 98.4, 98/62, pulse 96, Resp. 16, O2 SATs 100% with O2 @ 2L/ min via N/C. Skin tear to Lt lateral elbow covered with small pink foam dressing. 12 lead EKG obtained - WNL. Bed in low position, 3 rails up, bed alarm activated, mats at right and left bed sides, family in the room talking quietly. Currently patient is resting quietly with her eyes closed.

## 2015-06-03 NOTE — Progress Notes (Signed)
   PATIENT ID: Theresa Rodriguez   2 Days Post-Op Procedure(s) (LRB): ARTHROPLASTY BIPOLAR HIP (HEMIARTHROPLASTY) for Femur Fracture (Right)  Subjective: In some pain this am. Per daughter she fell when alone in room last night. Repeat xrays of pelvis show no new injury and implant in place. More sore this am.   Objective:  Filed Vitals:   06/03/15 0509  BP: 109/88  Pulse: 112  Temp: 98.1 F (36.7 C)  Resp: 16     Right hip dressing saturated and changed today No active bleeding for incision, appears benign Leg length equal Distally NVI  Labs:   Recent Labs  06/01/15 0547 06/02/15 0616 06/03/15 0622  HGB 10.4* 7.8* 6.2*   Recent Labs  06/02/15 0616 06/03/15 0622  WBC 11.1* 9.6  RBC 2.56* 2.05*  HCT 24.5* 19.2*  PLT 160 157   Recent Labs  06/02/15 0616 06/03/15 0622  NA 138 138  K 4.4 4.4  CL 102 104  CO2 29 28  BUN 16 19  CREATININE 1.25* 1.26*  GLUCOSE 100* 106*  CALCIUM 8.0* 7.8*    Assessment and Plan: 2 days s/p right hip hemi for fracture ABLA- down to 6.2, will transfuse 2 units packed RBC today Fall after surgery- high fall risk, xrays look okay and will continue to observe Continue WBAT Ordered tramadol prn pain, avoid narcotics D/c per primary team, okay to d/c from ortho standpoint after transfusion today  VTE proph: ASA 325mg  BID

## 2015-06-03 NOTE — Progress Notes (Signed)
PROGRESS NOTE  Theresa Rodriguez UUE:[id number] DOB: 09/11/1922 DOA: 05/31/2015 PCP: Woody Seller, MD  Assessment/Plan: right hip fracture  Secondary to mechanical fall. Patient status post right hip hemiarthroplasty per Dr. Tamera Punt 06/01/2015.  -PT/OT--skilled nursing facility -Per orthopedics.-Continue aspirin 325 mg twice a day for DVT prophylaxis  Mechanical fall -Late afternoon 06/02/2015--patient was found sitting on the floor -EKG sinus rhythm without any ischemic changes -06/02/2015 pelvic x-ray withoutout any dislocation -move to camera room -bed alarm  end-stage dementia with behavioral disturbances Stable. Continue home regimen of Depakote sprinkles, Remeron, Celexa. -06/03/2015 Minimize hypnotic and sedative medications as the patient was somnolent after alprazolam -Discontinue alprazolam, Phenergan, Remeron, Norco, morphine   dehydration Secondary to poor oral intake. Gentle hydration. -Patient has poor oral intake -Family does not want gastrostomy tube  hypomagnesemia  -Replete  -Recheck in the morning  chronic kidney disease stage II- III Stable. Follow. -Baseline creatinine 0.9-1.2 -Discontinue Mobic -Increase IV fluids to 75 mL per hour  hypertension Per admitting physician, patient's blood pressure medicines were discontinued and blood pressure has been controlled. Follow for now.  depression/anxiety Stable. Continue Celexa. -Continue Depakote  ??? Hypothyroidism TSH slightly elevated at 5.082. Per family patient has never been on Synthroid as pharmacy has checked.  - check free T4 -Suspect euthyroid sick syndrome  Acute blood loss anemia -Hemoglobin dilutional drop also contributed -Baseline hemoglobin-10 -Transfuse for hemoglobin less than 7 -06/03/2015 transfused 2 units PRBC  prophylaxis DVT prophylaxis per orthopedics.  Code Status: DO NOT RESUSCITATE Family Communication: Daughter updated at bedside. Disposition Plan:  SNF on 06/04/2015 if medically stable      Procedures/Studies: Dg Chest 1 View  05/31/2015   CLINICAL DATA:  Right hip pain.  Preop right hip ORIF.  EXAM: CHEST  1 VIEW  COMPARISON:  02/03/2014  FINDINGS: The lungs are hyperinflated likely secondary to COPD. There is no focal parenchymal opacity. There is no pleural effusion or pneumothorax. The heart and mediastinal contours are unremarkable.  The osseous structures are unremarkable.  IMPRESSION: No active disease.   Electronically Signed   By: Kathreen Devoid   On: 05/31/2015 09:44   Ct Head Wo Contrast  05/31/2015   CLINICAL DATA:  Pain following fall  EXAM: CT HEAD WITHOUT CONTRAST  CT CERVICAL SPINE WITHOUT CONTRAST  TECHNIQUE: Multidetector CT imaging of the head and cervical spine was performed following the standard protocol without intravenous contrast. Multiplanar CT image reconstructions of the cervical spine were also generated.  COMPARISON:  Head CT September 22, 2013  FINDINGS: CT HEAD FINDINGS  Motion artifact makes this study somewhat less than optimal. There is moderate diffuse atrophy, stable. There is no intracranial mass, hemorrhage, extra-axial fluid collection, or midline shift. There is patchy small vessel disease in the centra semiovale bilaterally. There is no acute infarct evident on this study. There is midline frontal scalp hematoma. The bony calvarium appears intact. The mastoid air cells are clear.  CT CERVICAL SPINE FINDINGS  Motion artifact makes this study less than optimal. No fracture or spondylolisthesis is appreciable on this study. Prevertebral soft tissues and predental space regions are normal. There is milder disc space narrowing at C3-4, C4-5, and C5-6. There is moderate facet hypertrophy at essentially all levels bilaterally. No frank disc extrusion or high-grade stenosis appreciable. There is mild scarring in each lung apex.  IMPRESSION: CT head: Study limited due to motion artifact. There is atrophy with  periventricular small vessel disease. No acute infarct  evident. No hemorrhage, mass, or extra-axial fluid. Midline scalp hematoma without underlying fracture appreciable.  CT cervical spine: Study less than optimal due to motion artifact. Moderate osteoarthritic change at multiple levels. No fracture or spondylolisthesis apparent.   Electronically Signed   By: Lowella Grip III M.D.   On: 05/31/2015 10:01   Ct Cervical Spine Wo Contrast  05/31/2015   CLINICAL DATA:  Pain following fall  EXAM: CT HEAD WITHOUT CONTRAST  CT CERVICAL SPINE WITHOUT CONTRAST  TECHNIQUE: Multidetector CT imaging of the head and cervical spine was performed following the standard protocol without intravenous contrast. Multiplanar CT image reconstructions of the cervical spine were also generated.  COMPARISON:  Head CT September 22, 2013  FINDINGS: CT HEAD FINDINGS  Motion artifact makes this study somewhat less than optimal. There is moderate diffuse atrophy, stable. There is no intracranial mass, hemorrhage, extra-axial fluid collection, or midline shift. There is patchy small vessel disease in the centra semiovale bilaterally. There is no acute infarct evident on this study. There is midline frontal scalp hematoma. The bony calvarium appears intact. The mastoid air cells are clear.  CT CERVICAL SPINE FINDINGS  Motion artifact makes this study less than optimal. No fracture or spondylolisthesis is appreciable on this study. Prevertebral soft tissues and predental space regions are normal. There is milder disc space narrowing at C3-4, C4-5, and C5-6. There is moderate facet hypertrophy at essentially all levels bilaterally. No frank disc extrusion or high-grade stenosis appreciable. There is mild scarring in each lung apex.  IMPRESSION: CT head: Study limited due to motion artifact. There is atrophy with periventricular small vessel disease. No acute infarct evident. No hemorrhage, mass, or extra-axial fluid. Midline scalp hematoma  without underlying fracture appreciable.  CT cervical spine: Study less than optimal due to motion artifact. Moderate osteoarthritic change at multiple levels. No fracture or spondylolisthesis apparent.   Electronically Signed   By: Lowella Grip III M.D.   On: 05/31/2015 10:01   Dg Pelvis Portable  06/02/2015   CLINICAL DATA:  Right hip arthroplasty.  EXAM: PORTABLE PELVIS 1-2 VIEWS  COMPARISON:  None.  FINDINGS: Interval right hip arthroplasty. No fracture or dislocation. No hardware failure or complication. Postsurgical changes in the soft tissues surrounding the right hip.  No left hip fracture or dislocation.  Degenerative changes of the lower lumbar spine.  IMPRESSION: Interval right hip arthroplasty.   Electronically Signed   By: Kathreen Devoid   On: 06/02/2015 19:13   Pelvis Portable  06/01/2015   CLINICAL DATA:  Postop right hip hemiarthroplasty for right femoral neck fracture.  EXAM: PORTABLE PELVIS 1-2 VIEWS  COMPARISON:  Preoperative AP pelvis and right hip x-rays yesterday.  FINDINGS: Anatomic alignment post cemented right hip hemiarthroplasty. No acute complicating features. Osseous demineralization throughout the pelvis and degenerative changes in the lower lumbar spine again noted.  IMPRESSION: Anatomic alignment post right hip hemiarthroplasty without acute complicating features.   Electronically Signed   By: Evangeline Dakin M.D.   On: 06/01/2015 15:23   Dg Hip Unilat With Pelvis 2-3 Views Right  05/31/2015   CLINICAL DATA:  Status post fall today. Right hip pain. Initial encounter.  EXAM: DG HIP (WITH OR WITHOUT PELVIS) 2-3V RIGHT  COMPARISON:  None.  FINDINGS: The patient has an acute fracture of the mid aspect of the right femoral neck. The femoral head is located. Mild varus angulation about the fracture is identified. No other acute bony or joint abnormality is seen.  IMPRESSION: Acute fracture mid  right femoral neck.   Electronically Signed   By: Inge Rise M.D.   On:  05/31/2015 09:44         Subjective:  patient is somnolent but arousable. No reports of vomiting, respiratory distress, uncontrolled pain, diarrhea. Review of systems unattainable secondary to patient's encephalopathy.  Objective: Filed Vitals:   06/03/15 1311 06/03/15 1452 06/03/15 1513 06/03/15 1652  BP: 100/61 87/43 103/69 121/82  Pulse: 87 88 90 77  Temp: 98.1 F (36.7 C) 98.2 F (36.8 C) 98.3 F (36.8 C) 97.9 F (36.6 C)  TempSrc: Oral Axillary Axillary Axillary  Resp: 15 17 20 19   SpO2: 93% 100% 100% 100%    Intake/Output Summary (Last 24 hours) at 06/03/15 1837 Last data filed at 06/03/15 1650  Gross per 24 hour  Intake 1172.5 ml  Output    400 ml  Net  772.5 ml   Weight change:  Exam:   General:  Pt is alert, follows commands appropriately, not in acute distress  HEENT: No icterus, No thrush, No neck mass, [place]/AT  Cardiovascular: RRR, S1/S2, no rubs, no gallops  Respiratory: poor ventilatory effort but clear to auscultation. No wheezing.   Abdomen: Soft/+BS, non tender, non distended, no guarding; no hepatosplenomegaly   Extremities: No edema, No lymphangitis, No petechiae, No rashes, no synovitis; no cyanosis or clubbing   Data Reviewed: Basic Metabolic Panel:  Recent Labs Lab 05/31/15 0910 06/01/15 0547 06/02/15 0616 06/03/15 0622  NA 142 137 138 138  K 4.7 4.2 4.4 4.4  CL 101 99* 102 104  CO2 31 31 29 28   GLUCOSE 94 104* 100* 106*  BUN 39* 17 16 19   CREATININE 1.20* 0.91 1.25* 1.26*  CALCIUM 9.3 8.6* 8.0* 7.8*  MG  --   --   --  1.3*   Liver Function Tests: No results for input(s): AST, ALT, ALKPHOS, BILITOT, PROT, ALBUMIN in the last 168 hours. No results for input(s): LIPASE, AMYLASE in the last 168 hours. No results for input(s): AMMONIA in the last 168 hours. CBC:  Recent Labs Lab 05/31/15 0910 06/01/15 0547 06/02/15 0616 06/03/15 0622  WBC 6.3 8.4 11.1* 9.6  NEUTROABS 4.7  --   --   --   HGB 11.0* 10.4* 7.8* 6.2*  HCT  35.0* 33.4* 24.5* 19.2*  MCV 95.4 95.4 95.7 93.7  PLT 167 168 160 157   Cardiac Enzymes: No results for input(s): CKTOTAL, CKMB, CKMBINDEX, TROPONINI in the last 168 hours. BNP: Invalid input(s): POCBNP CBG: No results for input(s): GLUCAP in the last 168 hours.  Recent Results (from the past 240 hour(s))  MRSA PCR Screening     Status: None   Collection Time: 06/01/15 12:21 AM  Result Value Ref Range Status   MRSA by PCR NEGATIVE NEGATIVE Final    Comment:        The GeneXpert MRSA Assay (FDA approved for NASAL specimens only), is one component of a comprehensive MRSA colonization surveillance program. It is not intended to diagnose MRSA infection nor to guide or monitor treatment for MRSA infections.      Scheduled Meds: . aspirin EC  325 mg Oral BID  . cholecalciferol  2,000 Units Oral Daily  . citalopram  10 mg Oral Daily  . divalproex  250 mg Oral TID  . docusate sodium  100 mg Oral BID  . heparin  5,000 Units Subcutaneous 3 times per day  . magnesium sulfate 1 - 4 g bolus IVPB  2 g Intravenous Once  .  multivitamin  5 mL Oral Daily   Continuous Infusions: . dextrose 5 % and 0.9% NaCl 1,000 mL infusion       Semir Brill, DO  [facility] Pager [phone]  If 7PM-7AM, please contact night-coverage [email or web address] Password [facility] 06/03/2015, 6:37 PM   LOS: 3 days

## 2015-06-03 NOTE — Progress Notes (Signed)
PT Cancellation Note  Patient Details Name: Zhavia Cunanan MRN: [id number] DOB: 09-05-1922   Cancelled Treatment:    Reason Eval/Treat Not Completed: Patient not medically ready Holding PT tx as pt with Hgb 6.7 today awaiting blood transfusion. Plans to [facility]c [facility]. Will follow up.   [place] 06/03/2015, 1:52 PM Wray Kearns, [place], DPT [phone]

## 2015-06-04 LAB — BASIC METABOLIC PANEL
ANION GAP: 11 (ref 5–15)
BUN: 18 mg/dL (ref 6–20)
CALCIUM: 8.3 mg/dL — AB (ref 8.9–10.3)
CO2: 26 mmol/L (ref 22–32)
Chloride: 102 mmol/L (ref 101–111)
Creatinine, Ser: 1.13 mg/dL — ABNORMAL HIGH (ref 0.44–1.00)
GFR, EST AFRICAN AMERICAN: 48 mL/min — AB (ref >60)
GFR, EST NON AFRICAN AMERICAN: 41 mL/min — AB (ref >60)
Glucose, Bld: 104 mg/dL — ABNORMAL HIGH (ref 65–99)
Potassium: 4.5 mmol/L (ref 3.5–5.1)
Sodium: 139 mmol/L (ref 135–145)

## 2015-06-04 LAB — TYPE AND SCREEN
ABO/RH(D): A NEG
Antibody Screen: POSITIVE
DAT, IGG: NEGATIVE
UNIT DIVISION: 0
Unit division: 0
Unit division: 0
Unit division: 0

## 2015-06-04 LAB — CBC
HEMATOCRIT: 31.1 % — AB (ref 36.0–46.0)
Hemoglobin: 10.5 g/dL — ABNORMAL LOW (ref 12.0–15.0)
MCH: 30 pg (ref 26.0–34.0)
MCHC: 33.8 g/dL (ref 30.0–36.0)
MCV: 88.9 fL (ref 78.0–100.0)
PLATELETS: 187 10*3/uL (ref 150–400)
RBC: 3.5 MIL/uL — ABNORMAL LOW (ref 3.87–5.11)
RDW: 16.5 % — AB (ref 11.5–15.5)
WBC: 11.9 10*3/uL — AB (ref 4.0–10.5)

## 2015-06-04 LAB — T4, FREE: FREE T4: 0.91 ng/dL (ref 0.61–1.12)

## 2015-06-04 LAB — MAGNESIUM: MAGNESIUM: 2.1 mg/dL (ref 1.7–2.4)

## 2015-06-04 MED ORDER — ALPRAZOLAM 0.25 MG PO TABS: 0.5000 mg | ORAL_TABLET | Freq: Three times a day (TID) | ORAL | Status: AC | PRN

## 2015-06-04 MED ORDER — ASPIRIN 325 MG PO TBEC: 325.0000 mg | DELAYED_RELEASE_TABLET | Freq: Two times a day (BID) | ORAL | Status: AC

## 2015-06-04 MED ORDER — TRAMADOL HCL 50 MG PO TABS: 50.0000 mg | ORAL_TABLET | Freq: Four times a day (QID) | ORAL | Status: DC | PRN

## 2015-06-04 NOTE — Discharge Summary (Signed)
Physician Discharge Summary  Theresa Rodriguez IZT:[id number] DOB: 1922/06/14 DOA: 05/31/2015  PCP: Woody Seller, MD  Admit date: 05/31/2015 Discharge date: 06/04/2015  Recommendations for Outpatient Follow-up:  1. Pt will need to follow up with PCP in 2 weeks post discharge 2. Please obtain BMP and CBC in 2 weeks Discharge Diagnoses:  right hip fracture  -Secondary to mechanical fall.  -status post right hip hemiarthroplasty-- Dr. Tamera Punt 06/01/2015.  -PT/OT--skilled nursing facility -Per orthopedics.-Continue aspirin 325 mg twice a day for DVT prophylaxis -Continue aspirin 325 mg twice a day through 06/29/2015, then back to once daily starting 06/30/2015  Mechanical fall -Late afternoon 06/02/2015--patient was found sitting on the floor -EKG sinus rhythm without any ischemic changes -06/02/2015 pelvic x-ray withoutout any dislocation -move to camera room -bed alarm -Patient did not have any more falls  end-stage dementia with behavioral disturbances Stable. Continue home regimen of Depakote sprinkles, Remeron, Celexa. -06/03/2015 Minimize hypnotic and sedative medications as the patient was somnolent after alprazolam -Discontinue alprazolam, Phenergan, Remeron, Norco, morphine during the hospitalization -decrease alprazolam to 0.25mg  tid prn anxiety after d/c  dehydration Secondary to poor oral intake. Gentle hydration. -Patient has poor oral intake -Family does not want gastrostomy tube hypomagnesemia  -Replete  -Recheck in the morning  chronic kidney disease stage II- III Stable. Follow. -Baseline creatinine 0.9-1.2 -Discontinue Mobic -Patient received fluids throughout the hospitalization  hypertension Per admitting physician, patient's blood pressure medicines were discontinued and blood pressure has been controlled. Follow for now.  depression/anxiety Stable. Continue Celexa. -Continue Depakote  Euthyroid sick syndrome  TSH slightly elevated at  5.082. Per family patient has never been on Synthroid. - check free T4--0.91  -Suspect euthyroid sick syndrome  Acute blood loss anemia -Hemoglobin dilutional drop also contributed -Baseline hemoglobin-10 -Transfuse for hemoglobin less than 7 -06/03/2015 transfused 2 units PRBC -Hemoglobin 10.5 on the day of discharge  Discharge Condition: stable  Disposition: SNF Follow-up Information    Follow up with Nita Sells, MD In 2 weeks.   Specialty:  Orthopedic Surgery   Contact information:   [place] [address] [phone]       Diet:regular Wt Readings from Last 3 Encounters:  02/03/14 41.901 kg (92 lb 6 oz)  01/21/14 42.275 kg (93 lb 3.2 oz)  01/12/14 42.185 kg (93 lb)    History of present illness:  81 y.o. female , widowed, resident of [facility] SNF, ambulates independently, PMH significant for end-stage dementia with behavioral disturbances, vitamin D deficiency, HLD, hypothyroid, osteoporosis, anxiety & depression, HTN, DO NOT RESUSCITATE, was transferred from SNF to [facility] ED on 05/31/15 with complaints of right hip pain following a fall at SNF. Patient is unable to provide any history secondary to her advanced dementia. History is obtained from patient's 2 daughters at bedside. At baseline, patient has end-stage dementia and she actively ambulates in the nursing facility without support. Family takes her out of SNF frequently for family outings. On 05/30/15, her daughter took her out during one such a family outing and she returned to nursing facility at approximately 4:30 PM. Patient went into an other clients room and laid down on the bed. A short while later, nursing home staff noticed that patient was laying on the floor with deformity of her right lower extremity and she was in some degree of pain. This was an unwitnessed fall and no report regarding LOC or head and neck injury. X-rays were performed at nursing facility and  patient was found to have an  acute right hip fracture.  Consultants: Ortho--[place]  Discharge Exam: Filed Vitals:   06/04/15 0554  BP: 158/42  Pulse: 65  Temp: 99.3 F (37.4 C)  Resp: 20   Filed Vitals:   06/03/15 1513 06/03/15 1652 06/03/15 2018 06/04/15 0554  BP: 103/69 121/82 138/82 158/42  Pulse: 90 77 62 65  Temp: 98.3 F (36.8 C) 97.9 F (36.6 C) 98.7 F (37.1 C) 99.3 F (37.4 C)  TempSrc: Axillary Axillary    Resp: 20 19 20 20   SpO2: 100% 100% 95% 95%   General: Awake and alert, NAD, pleasant Cardiovascular: RRR, no rub, no gallop, no S3 Respiratory: Poor inspiratory effort but clear to auscultation. Abdomen:soft, nontender, nondistended, positive bowel sounds Extremities: No edema, No lymphangitis, no petechiae  Discharge Instructions      Discharge Instructions    Diet - low sodium heart healthy    Complete by:  As directed      Increase activity slowly    Complete by:  As directed      Weight bearing as tolerated    Complete by:  As directed             Medication List    STOP taking these medications        meloxicam 7.5 MG tablet  Commonly known as:  MOBIC      TAKE these medications        ALPRAZolam 0.25 MG tablet  Commonly known as:  XANAX  Take 2 tablets (0.5 mg total) by mouth 3 (three) times daily as needed for anxiety.     aspirin 325 MG EC tablet  Take 1 tablet (325 mg total) by mouth 2 (two) times daily. Through 06/29/15, then take 1 tablet (325mg ) daily starting 06/30/15     citalopram 10 MG tablet  Commonly known as:  CELEXA  Take 10 mg by mouth daily.     divalproex 125 MG capsule  Commonly known as:  DEPAKOTE SPRINKLE  Take 250 mg by mouth 3 (three) times daily.     flintstones complete 60 MG chewable tablet  Chew 2 tablets by mouth daily.     HYDROcodone-acetaminophen 5-325 MG per tablet  Commonly known as:  NORCO  Take 1 tablet by mouth every 6 (six) hours as needed for moderate pain.     ibuprofen 400 MG  tablet  Commonly known as:  ADVIL,MOTRIN  Take 400 mg by mouth every 4 (four) hours as needed for mild pain.     mirtazapine 15 MG tablet  Commonly known as:  REMERON  Take 7.5 mg by mouth at bedtime.     NON FORMULARY  Take 1 each by mouth daily. "herbal life" shakes     Vitamin D3 2000 UNITS Chew  Chew 1 tablet by mouth daily.         The results of significant diagnostics from this hospitalization (including imaging, microbiology, ancillary and laboratory) are listed below for reference.    Significant Diagnostic Studies: Dg Chest 1 View  05/31/2015   CLINICAL DATA:  Right hip pain.  Preop right hip ORIF.  EXAM: CHEST  1 VIEW  COMPARISON:  02/03/2014  FINDINGS: The lungs are hyperinflated likely secondary to COPD. There is no focal parenchymal opacity. There is no pleural effusion or pneumothorax. The heart and mediastinal contours are unremarkable.  The osseous structures are unremarkable.  IMPRESSION: No active disease.   Electronically Signed   By: Kathreen Devoid   On: 05/31/2015 09:44   Ct  Head Wo Contrast  05/31/2015   CLINICAL DATA:  Pain following fall  EXAM: CT HEAD WITHOUT CONTRAST  CT CERVICAL SPINE WITHOUT CONTRAST  TECHNIQUE: Multidetector CT imaging of the head and cervical spine was performed following the standard protocol without intravenous contrast. Multiplanar CT image reconstructions of the cervical spine were also generated.  COMPARISON:  Head CT September 22, 2013  FINDINGS: CT HEAD FINDINGS  Motion artifact makes this study somewhat less than optimal. There is moderate diffuse atrophy, stable. There is no intracranial mass, hemorrhage, extra-axial fluid collection, or midline shift. There is patchy small vessel disease in the centra semiovale bilaterally. There is no acute infarct evident on this study. There is midline frontal scalp hematoma. The bony calvarium appears intact. The mastoid air cells are clear.  CT CERVICAL SPINE FINDINGS  Motion artifact makes this  study less than optimal. No fracture or spondylolisthesis is appreciable on this study. Prevertebral soft tissues and predental space regions are normal. There is milder disc space narrowing at C3-4, C4-5, and C5-6. There is moderate facet hypertrophy at essentially all levels bilaterally. No frank disc extrusion or high-grade stenosis appreciable. There is mild scarring in each lung apex.  IMPRESSION: CT head: Study limited due to motion artifact. There is atrophy with periventricular small vessel disease. No acute infarct evident. No hemorrhage, mass, or extra-axial fluid. Midline scalp hematoma without underlying fracture appreciable.  CT cervical spine: Study less than optimal due to motion artifact. Moderate osteoarthritic change at multiple levels. No fracture or spondylolisthesis apparent.   Electronically Signed   By: Lowella Grip III M.D.   On: 05/31/2015 10:01   Ct Cervical Spine Wo Contrast  05/31/2015   CLINICAL DATA:  Pain following fall  EXAM: CT HEAD WITHOUT CONTRAST  CT CERVICAL SPINE WITHOUT CONTRAST  TECHNIQUE: Multidetector CT imaging of the head and cervical spine was performed following the standard protocol without intravenous contrast. Multiplanar CT image reconstructions of the cervical spine were also generated.  COMPARISON:  Head CT September 22, 2013  FINDINGS: CT HEAD FINDINGS  Motion artifact makes this study somewhat less than optimal. There is moderate diffuse atrophy, stable. There is no intracranial mass, hemorrhage, extra-axial fluid collection, or midline shift. There is patchy small vessel disease in the centra semiovale bilaterally. There is no acute infarct evident on this study. There is midline frontal scalp hematoma. The bony calvarium appears intact. The mastoid air cells are clear.  CT CERVICAL SPINE FINDINGS  Motion artifact makes this study less than optimal. No fracture or spondylolisthesis is appreciable on this study. Prevertebral soft tissues and predental space  regions are normal. There is milder disc space narrowing at C3-4, C4-5, and C5-6. There is moderate facet hypertrophy at essentially all levels bilaterally. No frank disc extrusion or high-grade stenosis appreciable. There is mild scarring in each lung apex.  IMPRESSION: CT head: Study limited due to motion artifact. There is atrophy with periventricular small vessel disease. No acute infarct evident. No hemorrhage, mass, or extra-axial fluid. Midline scalp hematoma without underlying fracture appreciable.  CT cervical spine: Study less than optimal due to motion artifact. Moderate osteoarthritic change at multiple levels. No fracture or spondylolisthesis apparent.   Electronically Signed   By: Lowella Grip III M.D.   On: 05/31/2015 10:01   Dg Pelvis Portable  06/02/2015   CLINICAL DATA:  Right hip arthroplasty.  EXAM: PORTABLE PELVIS 1-2 VIEWS  COMPARISON:  None.  FINDINGS: Interval right hip arthroplasty. No fracture or dislocation. No hardware failure  or complication. Postsurgical changes in the soft tissues surrounding the right hip.  No left hip fracture or dislocation.  Degenerative changes of the lower lumbar spine.  IMPRESSION: Interval right hip arthroplasty.   Electronically Signed   By: Kathreen Devoid   On: 06/02/2015 19:13   Pelvis Portable  06/01/2015   CLINICAL DATA:  Postop right hip hemiarthroplasty for right femoral neck fracture.  EXAM: PORTABLE PELVIS 1-2 VIEWS  COMPARISON:  Preoperative AP pelvis and right hip x-rays yesterday.  FINDINGS: Anatomic alignment post cemented right hip hemiarthroplasty. No acute complicating features. Osseous demineralization throughout the pelvis and degenerative changes in the lower lumbar spine again noted.  IMPRESSION: Anatomic alignment post right hip hemiarthroplasty without acute complicating features.   Electronically Signed   By: Evangeline Dakin M.D.   On: 06/01/2015 15:23   Dg Hip Unilat With Pelvis 2-3 Views Right  05/31/2015   CLINICAL DATA:   Status post fall today. Right hip pain. Initial encounter.  EXAM: DG HIP (WITH OR WITHOUT PELVIS) 2-3V RIGHT  COMPARISON:  None.  FINDINGS: The patient has an acute fracture of the mid aspect of the right femoral neck. The femoral head is located. Mild varus angulation about the fracture is identified. No other acute bony or joint abnormality is seen.  IMPRESSION: Acute fracture mid right femoral neck.   Electronically Signed   By: Inge Rise M.D.   On: 05/31/2015 09:44     Microbiology: Recent Results (from the past 240 hour(s))  MRSA PCR Screening     Status: None   Collection Time: 06/01/15 12:21 AM  Result Value Ref Range Status   MRSA by PCR NEGATIVE NEGATIVE Final    Comment:        The GeneXpert MRSA Assay (FDA approved for NASAL specimens only), is one component of a comprehensive MRSA colonization surveillance program. It is not intended to diagnose MRSA infection nor to guide or monitor treatment for MRSA infections.      Labs: Basic Metabolic Panel:  Recent Labs Lab 05/31/15 0910 06/01/15 0547 06/02/15 0616 06/03/15 0622 06/04/15 0442  NA 142 137 138 138 139  K 4.7 4.2 4.4 4.4 4.5  CL 101 99* 102 104 102  CO2 31 31 29 28 26   GLUCOSE 94 104* 100* 106* 104*  BUN 39* 17 16 19 18   CREATININE 1.20* 0.91 1.25* 1.26* 1.13*  CALCIUM 9.3 8.6* 8.0* 7.8* 8.3*  MG  --   --   --  1.3* 2.1   Liver Function Tests: No results for input(s): AST, ALT, ALKPHOS, BILITOT, PROT, ALBUMIN in the last 168 hours. No results for input(s): LIPASE, AMYLASE in the last 168 hours. No results for input(s): AMMONIA in the last 168 hours. CBC:  Recent Labs Lab 05/31/15 0910 06/01/15 0547 06/02/15 0616 06/03/15 0622 06/04/15 0442  WBC 6.3 8.4 11.1* 9.6 11.9*  NEUTROABS 4.7  --   --   --   --   HGB 11.0* 10.4* 7.8* 6.2* 10.5*  HCT 35.0* 33.4* 24.5* 19.2* 31.1*  MCV 95.4 95.4 95.7 93.7 88.9  PLT 167 168 160 157 187   Cardiac Enzymes: No results for input(s): CKTOTAL, CKMB,  CKMBINDEX, TROPONINI in the last 168 hours. BNP: Invalid input(s): POCBNP CBG: No results for input(s): GLUCAP in the last 168 hours.  Time coordinating discharge:  Greater than 30 minutes  Signed:  Natosha Bou, DO [facility] Pager: [phone] 06/04/2015, 10:02 AM

## 2015-06-04 NOTE — Discharge Planning (Signed)
Patient will discharge today per MD order. Patient will discharge to: (return) [facility] SN[facility] RN to call report prior to transportation to: [phone] Transportation: [facility] scheduled  CSW sent discharge summary to SNF for review.  Packet is complete.  RN, patient and family aware of discharge plans.  Nonnie Done, [place] [phone]  Psychiatric & Orthopedics (5N 1-16) Clinical Social Worker

## 2015-06-04 NOTE — Progress Notes (Signed)
Report called to RN @ [facility]

## 2015-06-17 DEATH — deceased

## 2016-10-13 IMAGING — CT CT CERVICAL SPINE W/O CM
3 of 5 series · 12 of 33 positions shown, 14 images · non-contrast
Comparison: Head CT September 22, 2013

CLINICAL DATA: Pain following fall

EXAM:
CT HEAD WITHOUT CONTRAST
CT CERVICAL SPINE WITHOUT CONTRAST
TECHNIQUE: Multidetector CT imaging of the head and cervical spine was
performed following the standard protocol without intravenous
contrast. Multiplanar CT image reconstructions of the cervical spine
were also generated.

[Series 5: coronals · coronal · 0.27mm/px · 3 of 49 slices shown]
[im 10/49  bone]
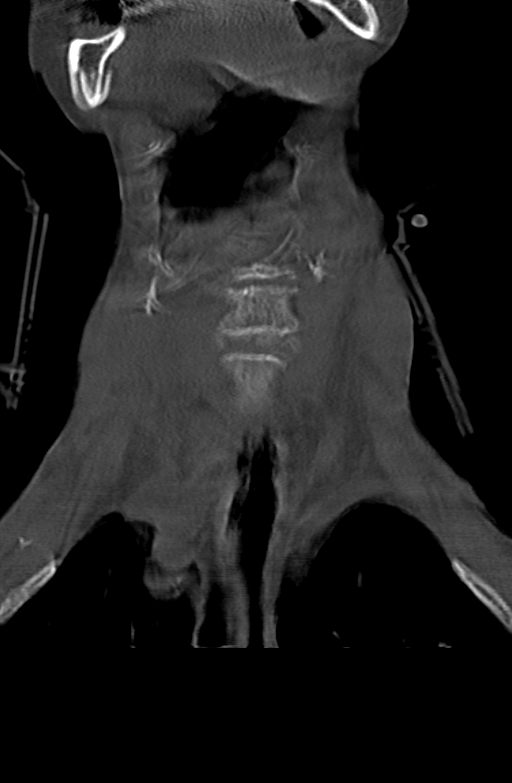
[im 20/49  bone]
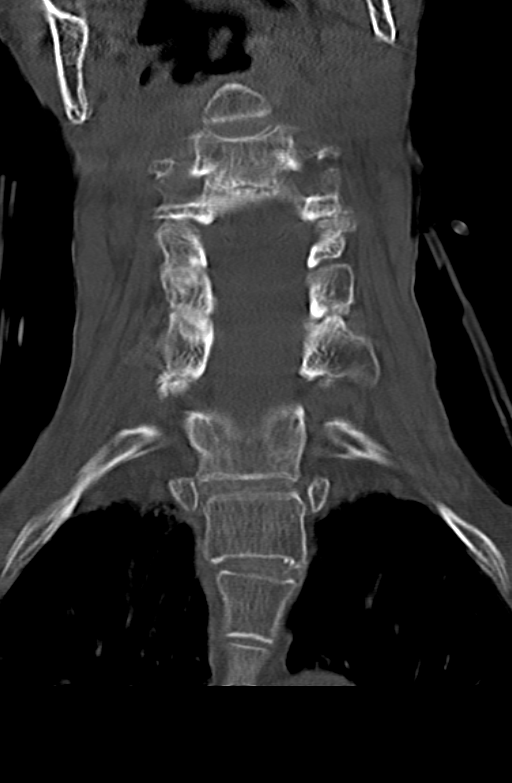
[im 29/49  bone]
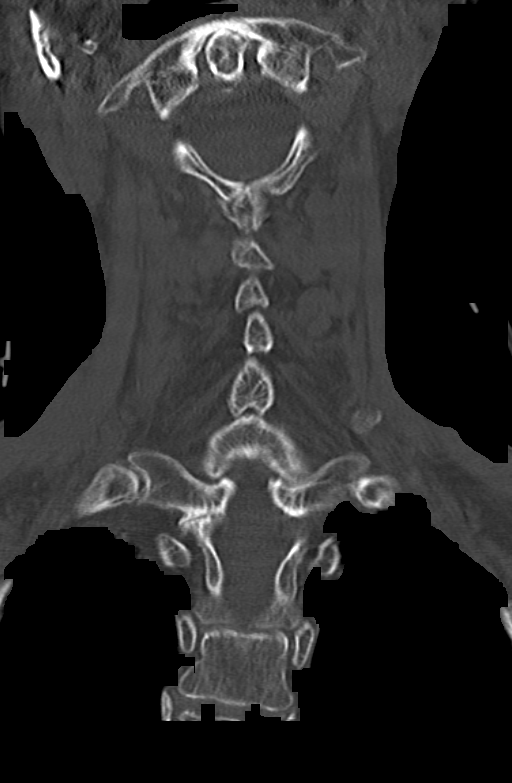

[Series 6: sagittals · sagittal · 0.29mm/px · 5 of 52 slices shown, 6 images]
[im 18/52  bone]
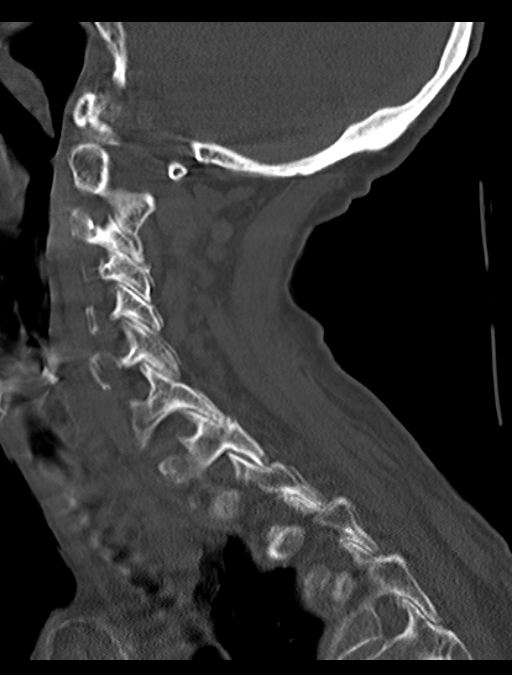
[im 22/52  bone]
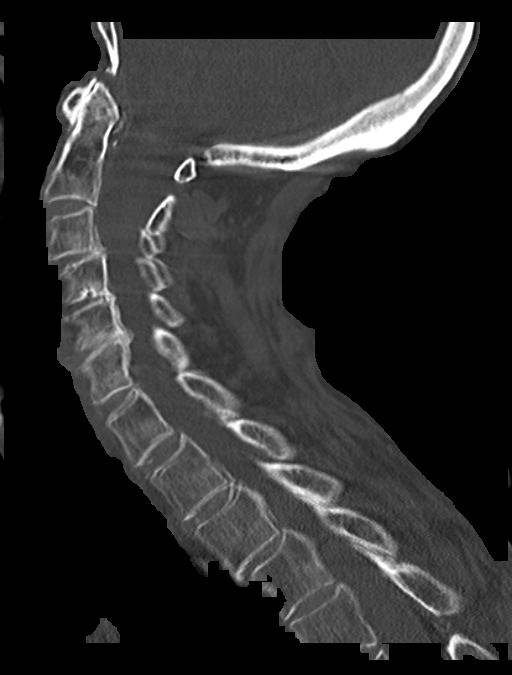
[im 26/52  soft-tissue]
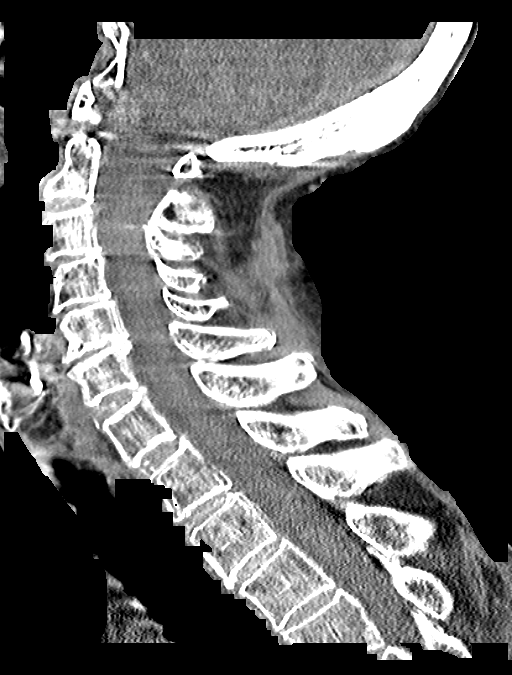
[im 26/52  bone]
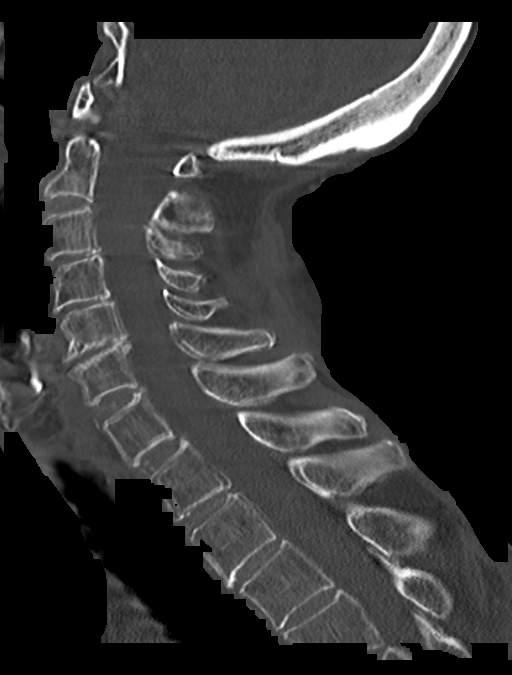
[im 30/52  bone]
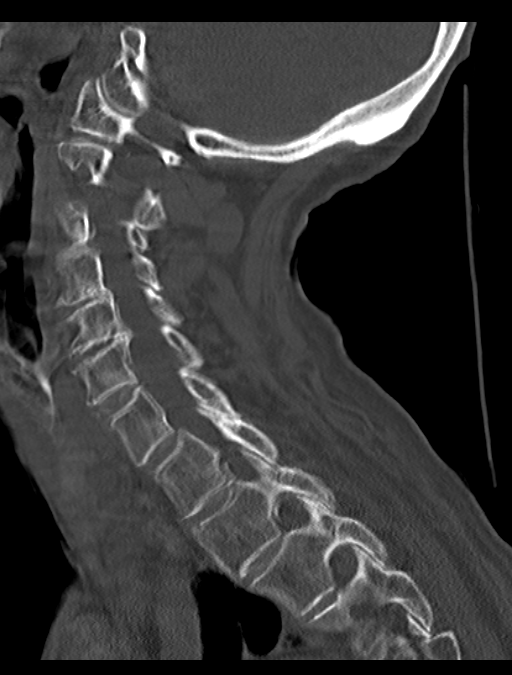
[im 35/52  bone]
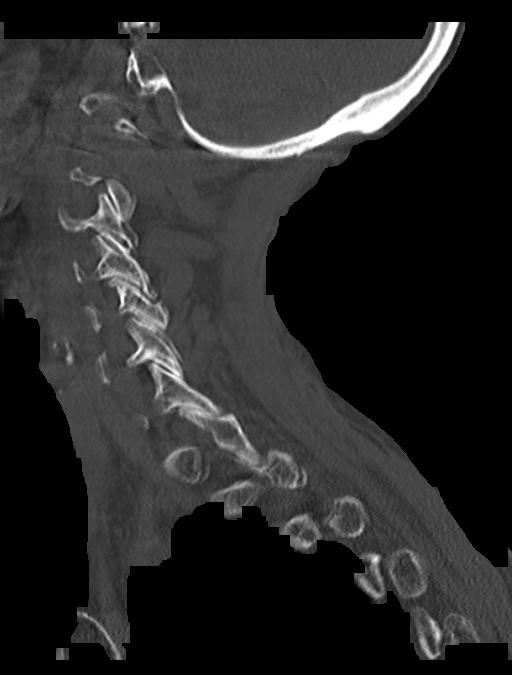

[Series 7: orthogonals · axial · 0.24mm/px · z∈[-579,-441]mm · 4 of 119 slices shown, 5 images]
[im 20/119  soft-tissue]
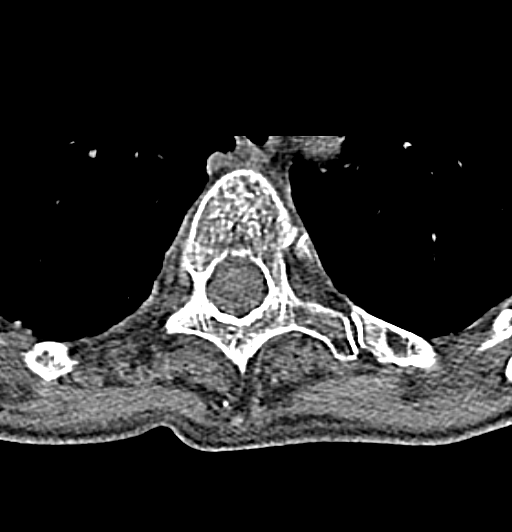
[im 20/119  bone]
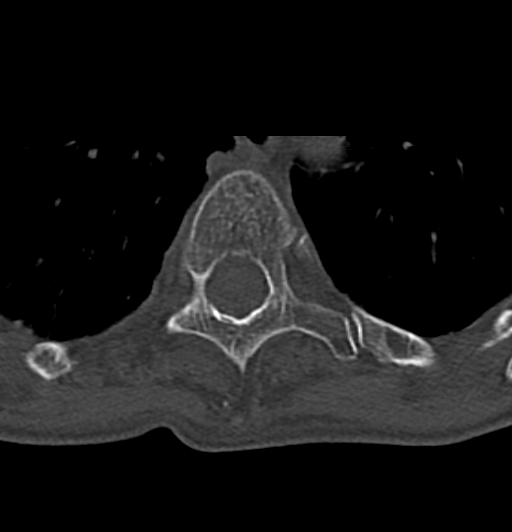
[im 40/119  bone]
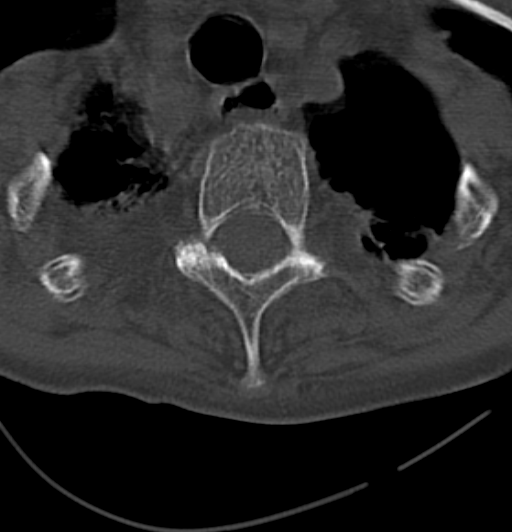
[im 79/119  bone]
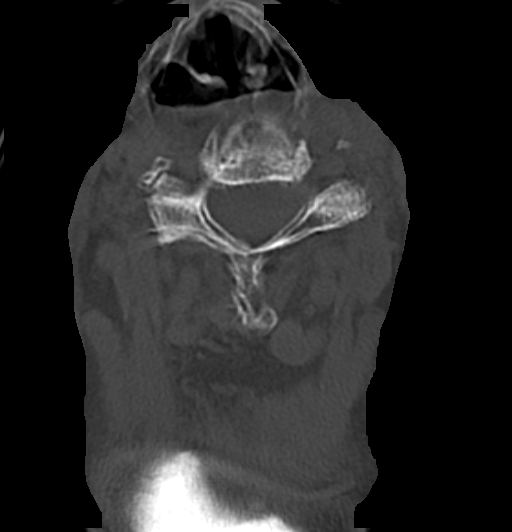
[im 99/119  bone]
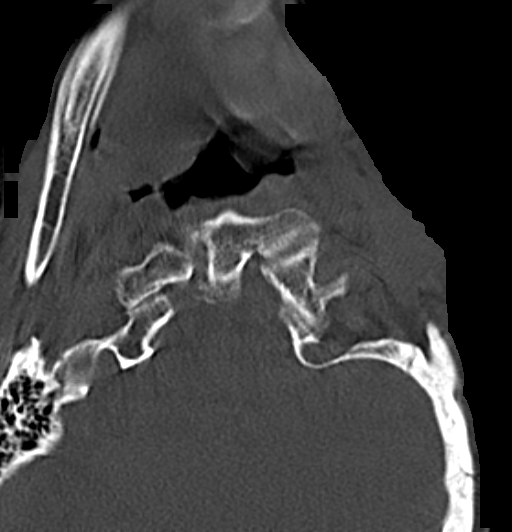

[12 of 33 positions shown; findings below may reference images not displayed]

FINDINGS: CT HEAD FINDINGS

Motion artifact makes this study somewhat less than optimal. There
is moderate diffuse atrophy, stable. There is no intracranial mass,
hemorrhage, extra-axial fluid collection, or midline shift. There is
patchy small vessel disease in the centra semiovale bilaterally.
There is no acute infarct evident on this study. There is midline
frontal scalp hematoma. The bony calvarium appears intact. The
mastoid air cells are clear.

CT CERVICAL SPINE FINDINGS

Motion artifact makes this study less than optimal. No fracture or
spondylolisthesis is appreciable on this study. Prevertebral soft
tissues and predental space regions are normal. There is milder disc
space narrowing at C3-4, C4-5, and C5-6. There is moderate facet
hypertrophy at essentially all levels bilaterally. No frank disc
extrusion or high-grade stenosis appreciable. There is mild scarring
in each lung apex.
IMPRESSION: CT head: Study limited due to motion artifact. There is atrophy with
periventricular small vessel disease. No acute infarct evident. No
hemorrhage, mass, or extra-axial fluid. Midline scalp hematoma
without underlying fracture appreciable.

CT cervical spine: Study less than optimal due to motion artifact.
Moderate osteoarthritic change at multiple levels. No fracture or
spondylolisthesis apparent.
